# Patient Record
Sex: Female | Born: 1955 | Race: White | Hispanic: No | Marital: Single | Smoking: Never smoker
Health system: Southern US, Community
[De-identification: ages and names within clinical notes are randomized; demographics above are authoritative.]

## PROBLEM LIST (undated history)

## (undated) DIAGNOSIS — F32A Depression, unspecified: Secondary | ICD-10-CM

## (undated) DIAGNOSIS — E559 Vitamin D deficiency, unspecified: Secondary | ICD-10-CM

## (undated) DIAGNOSIS — F429 Obsessive-compulsive disorder, unspecified: Secondary | ICD-10-CM

## (undated) DIAGNOSIS — R7309 Other abnormal glucose: Secondary | ICD-10-CM

## (undated) DIAGNOSIS — J45909 Unspecified asthma, uncomplicated: Secondary | ICD-10-CM

## (undated) DIAGNOSIS — H33313 Horseshoe tear of retina without detachment, bilateral: Secondary | ICD-10-CM

## (undated) DIAGNOSIS — E079 Disorder of thyroid, unspecified: Secondary | ICD-10-CM

## (undated) DIAGNOSIS — S92909A Unspecified fracture of unspecified foot, initial encounter for closed fracture: Secondary | ICD-10-CM

## (undated) DIAGNOSIS — F419 Anxiety disorder, unspecified: Secondary | ICD-10-CM

## (undated) DIAGNOSIS — R112 Nausea with vomiting, unspecified: Secondary | ICD-10-CM

## (undated) DIAGNOSIS — J40 Bronchitis, not specified as acute or chronic: Secondary | ICD-10-CM

## (undated) DIAGNOSIS — Z8489 Family history of other specified conditions: Secondary | ICD-10-CM

## (undated) DIAGNOSIS — Z9889 Other specified postprocedural states: Secondary | ICD-10-CM

## (undated) DIAGNOSIS — E039 Hypothyroidism, unspecified: Secondary | ICD-10-CM

## (undated) DIAGNOSIS — F329 Major depressive disorder, single episode, unspecified: Secondary | ICD-10-CM

## (undated) HISTORY — DX: Disorder of thyroid, unspecified: E07.9

## (undated) HISTORY — PX: TONSILLECTOMY: SUR1361

## (undated) HISTORY — DX: Vitamin D deficiency, unspecified: E55.9

## (undated) HISTORY — DX: Major depressive disorder, single episode, unspecified: F32.9

## (undated) HISTORY — PX: RETINAL TEAR REPAIR CRYOTHERAPY: SHX5304

## (undated) HISTORY — DX: Anxiety disorder, unspecified: F41.9

## (undated) HISTORY — PX: EYE SURGERY: SHX253

## (undated) HISTORY — DX: Horseshoe tear of retina without detachment, bilateral: H33.313

## (undated) HISTORY — DX: Unspecified fracture of unspecified foot, initial encounter for closed fracture: S92.909A

## (undated) HISTORY — DX: Obsessive-compulsive disorder, unspecified: F42.9

## (undated) HISTORY — DX: Other abnormal glucose: R73.09

## (undated) HISTORY — DX: Depression, unspecified: F32.A

## (undated) HISTORY — PX: TONSILLECTOMY AND ADENOIDECTOMY: SHX28

## (undated) HISTORY — PX: DILATION AND CURETTAGE OF UTERUS: SHX78

---

## 1992-04-26 DIAGNOSIS — E079 Disorder of thyroid, unspecified: Secondary | ICD-10-CM

## 1992-04-26 HISTORY — DX: Disorder of thyroid, unspecified: E07.9

## 1998-01-29 ENCOUNTER — Other Ambulatory Visit: Admission: RE | Admit: 1998-01-29 | Discharge: 1998-01-29 | Payer: Self-pay | Admitting: Family Medicine

## 2000-12-02 ENCOUNTER — Encounter: Payer: Self-pay | Admitting: Family Medicine

## 2000-12-02 ENCOUNTER — Encounter: Admission: RE | Admit: 2000-12-02 | Discharge: 2000-12-02 | Payer: Self-pay | Admitting: Family Medicine

## 2002-10-19 ENCOUNTER — Encounter: Payer: Self-pay | Admitting: Family Medicine

## 2002-10-19 ENCOUNTER — Encounter: Admission: RE | Admit: 2002-10-19 | Discharge: 2002-10-19 | Payer: Self-pay | Admitting: Family Medicine

## 2002-10-26 ENCOUNTER — Encounter: Payer: Self-pay | Admitting: Family Medicine

## 2002-10-26 ENCOUNTER — Encounter: Admission: RE | Admit: 2002-10-26 | Discharge: 2002-10-26 | Payer: Self-pay | Admitting: Family Medicine

## 2004-02-12 ENCOUNTER — Encounter: Admission: RE | Admit: 2004-02-12 | Discharge: 2004-02-12 | Payer: Self-pay | Admitting: Family Medicine

## 2004-04-01 ENCOUNTER — Other Ambulatory Visit: Admission: RE | Admit: 2004-04-01 | Discharge: 2004-04-01 | Payer: Self-pay | Admitting: Family Medicine

## 2005-06-18 ENCOUNTER — Encounter: Admission: RE | Admit: 2005-06-18 | Discharge: 2005-06-18 | Payer: Self-pay | Admitting: Emergency Medicine

## 2006-07-21 ENCOUNTER — Other Ambulatory Visit: Admission: RE | Admit: 2006-07-21 | Discharge: 2006-07-21 | Payer: Self-pay | Admitting: Family Medicine

## 2006-11-01 ENCOUNTER — Encounter: Admission: RE | Admit: 2006-11-01 | Discharge: 2006-11-01 | Payer: Self-pay | Admitting: Family Medicine

## 2007-09-19 ENCOUNTER — Other Ambulatory Visit: Admission: RE | Admit: 2007-09-19 | Discharge: 2007-09-19 | Payer: Self-pay | Admitting: Family Medicine

## 2008-11-13 ENCOUNTER — Encounter: Admission: RE | Admit: 2008-11-13 | Discharge: 2008-11-13 | Payer: Self-pay | Admitting: Family Medicine

## 2009-01-09 ENCOUNTER — Encounter: Admission: RE | Admit: 2009-01-09 | Discharge: 2009-01-09 | Payer: Self-pay | Admitting: Family Medicine

## 2009-02-21 ENCOUNTER — Other Ambulatory Visit: Admission: RE | Admit: 2009-02-21 | Discharge: 2009-02-21 | Payer: Self-pay | Admitting: Family Medicine

## 2010-02-25 ENCOUNTER — Other Ambulatory Visit: Admission: RE | Admit: 2010-02-25 | Discharge: 2010-02-25 | Payer: Self-pay | Admitting: Family Medicine

## 2010-10-27 ENCOUNTER — Ambulatory Visit
Admission: RE | Admit: 2010-10-27 | Discharge: 2010-10-27 | Disposition: A | Discharge status: Home / Self Care | Payer: BC Managed Care – PPO | Source: Ambulatory Visit | Attending: Family Medicine | Admitting: Family Medicine

## 2010-10-27 ENCOUNTER — Other Ambulatory Visit: Payer: Self-pay | Admitting: Family Medicine

## 2010-10-27 DIAGNOSIS — W19XXXA Unspecified fall, initial encounter: Secondary | ICD-10-CM

## 2011-03-03 ENCOUNTER — Other Ambulatory Visit: Payer: Self-pay | Admitting: Family Medicine

## 2011-03-03 DIAGNOSIS — Z1231 Encounter for screening mammogram for malignant neoplasm of breast: Secondary | ICD-10-CM

## 2011-04-06 ENCOUNTER — Ambulatory Visit: Payer: BC Managed Care – PPO

## 2011-04-27 DIAGNOSIS — S92909A Unspecified fracture of unspecified foot, initial encounter for closed fracture: Secondary | ICD-10-CM

## 2011-04-27 HISTORY — DX: Unspecified fracture of unspecified foot, initial encounter for closed fracture: S92.909A

## 2011-05-28 ENCOUNTER — Ambulatory Visit: Payer: BC Managed Care – PPO

## 2011-07-05 ENCOUNTER — Ambulatory Visit: Payer: BC Managed Care – PPO

## 2011-08-30 ENCOUNTER — Ambulatory Visit: Payer: BC Managed Care – PPO

## 2011-09-02 ENCOUNTER — Ambulatory Visit
Admission: RE | Admit: 2011-09-02 | Discharge: 2011-09-02 | Disposition: A | Discharge status: Home / Self Care | Payer: BC Managed Care – PPO | Source: Ambulatory Visit | Attending: Family Medicine | Admitting: Family Medicine

## 2011-09-02 DIAGNOSIS — Z1231 Encounter for screening mammogram for malignant neoplasm of breast: Secondary | ICD-10-CM

## 2012-06-06 ENCOUNTER — Other Ambulatory Visit: Payer: Self-pay | Admitting: Family Medicine

## 2012-06-06 DIAGNOSIS — N950 Postmenopausal bleeding: Secondary | ICD-10-CM

## 2012-06-09 ENCOUNTER — Other Ambulatory Visit: Payer: BC Managed Care – PPO

## 2012-06-14 ENCOUNTER — Ambulatory Visit
Admission: RE | Admit: 2012-06-14 | Discharge: 2012-06-14 | Disposition: A | Discharge status: Home / Self Care | Payer: BC Managed Care – PPO | Source: Ambulatory Visit | Attending: Family Medicine | Admitting: Family Medicine

## 2012-06-14 DIAGNOSIS — N950 Postmenopausal bleeding: Secondary | ICD-10-CM

## 2012-07-13 ENCOUNTER — Encounter: Payer: Self-pay | Admitting: Obstetrics and Gynecology

## 2012-07-13 ENCOUNTER — Ambulatory Visit (INDEPENDENT_AMBULATORY_CARE_PROVIDER_SITE_OTHER): Payer: BC Managed Care – PPO | Admitting: Obstetrics and Gynecology

## 2012-07-13 ENCOUNTER — Other Ambulatory Visit: Payer: Self-pay | Admitting: Obstetrics and Gynecology

## 2012-07-13 VITALS — BP 120/74 | Ht 63.75 in | Wt 184.0 lb

## 2012-07-13 DIAGNOSIS — N950 Postmenopausal bleeding: Secondary | ICD-10-CM

## 2012-07-13 NOTE — Progress Notes (Signed)
Patient ID: Daisy [name], female   DOB: [birth year], 57 y.o.   MRN: [id number]  57 y.o.Single Caucasian never sexually active female complaining of recurrent history of vaginal bleeding. She describes it as spotting and sometimes like a period and  sporadically since starting starting HRT in 2005 .  She  is currently on PremPro HRT .  She has had previous episodes of menopausal bleeding, and her PCP has lowered the dosage of her PremPro  due to this.  She has been menopausal for  unknown years. Patient skipped menses in May 2005 for one month and then immediately began hormone replacement therapy.  She has not had prior [facility] testing.   Associated symptoms are anxiety (and hot flashes.)   Workup to date: pelvic ultrasound at [facility] 06/14/12.  Uterus 7.8 x 3.0 x 5.4 cm.  Endometrium 11 mm.  Right ovary 2.9 x 2.0 x 1.9 cm.  Left ovary 3.7 x 2.1 x 2.9 cm.   reports that she has never smoked. She does not have any smokeless tobacco history on file. She reports that she does not drink alcohol or use illicit drugs.  There is no problem list on file for this patient.   Past Medical History  Diagnosis Date  . Anxiety   . Depression   . OCD (obsessive compulsive disorder)   . Broken foot 2013    right  . Thyroid disease 1994    hypothyroid--Dr. Fonnie Birkenhead  . Vitamin D deficiency     Past Surgical History  Procedure Laterality Date  . Tonsillectomy and adenoidectomy      age 36    Current Outpatient Prescriptions  Medication Sig Dispense Refill  . Albuterol (VENTOLIN IN) Inhale 2 puffs into the lungs as needed.      . Cholecalciferol (VITAMIN D3) 2000 UNITS capsule Take 2,000 Units by mouth 2 (two) times daily.      . fexofenadine (ALLEGRA) 180 MG tablet Take 180 mg by mouth as needed.      . fluticasone (VERAMYST) 27.5 MCG/SPRAY nasal spray Place 2 sprays into the nose as needed for rhinitis.      . Fluticasone-Salmeterol (ADVAIR) 250-50 MCG/DOSE AEPB Inhale 1 puff into the  lungs as needed.      Marland Kitchen LORazepam (ATIVAN) 1 MG tablet Take 1 tablet by mouth 2 (two) times daily.      . Multiple Vitamins-Minerals (MULTIVITAMIN PO) Take 1 tablet by mouth daily.      Marland Kitchen PREMPRO 0.3-1.5 MG per tablet Take 1 tablet by mouth daily.      . sertraline (ZOLOFT) 100 MG tablet Take 1.5 tablets by mouth daily.      Marland Kitchen SYNTHROID 100 MCG tablet Take 1 tablet by mouth daily.      . vitamin C (ASCORBIC ACID) 500 MG tablet Take 500 mg by mouth daily.       No current facility-administered medications for this visit.    WUJ:WJXBJYNWG items are noted in HPI.   Exam:    BP 120/74  Ht 5' 3.75" (1.619 m)  Wt 184 lb (83.462 kg)  BMI 31.84 kg/m2  LMP 04/26/2010  General appearance: overweight Abdomen: soft, nontender, nondistended.  No hepatosplenomegaly or organomegaly.   no inguinal nodes palpated  Pelvic: External genitalia:  no lesions              Bartholins and Skenes: normal                 Vagina: normal appearing vagina  with normal color and discharge, no lesions              Cervix: normal appearance                      Bimanual Exam:  Uterus:  uterus is normal size, shape, consistency and nontender.  Uterus retroverted.                                      Adnexa:    not indicated and normal adnexa in size, nontender and no masses                                      Rectovaginal: Not performed.                                      Anus:  defer exam  A:  Post menopausal bleeding  P:  Endometrial biopsy completed           .Endometrial Biopsy Procedure Note  Procedure Details  The risks (including infection, bleeding, pain, and uterine perforation) and benefits of the procedure were explained to the patient and Written and verbal informed consent was obtained.   The patient was placed in the dorsal lithotomy position.  Bimanual exam showed the uterus to be in the retroflexed position.  A speculum inserted in the vagina, and the cervix prepped with povidone  iodine.   A sharp tenaculum wasapplied to the anterior lip of the cervix for stabilization.   A Pipelle endometrial aspirator was used to sample the endometrium.  Sample was sent for pathologic examination.   Condition: Stable  Complications: None  Post procedure, patient developed cramping and nausea.  BP 142/82.  Pulse 70.  Patient given Ibuprophen 800 mg po, Sprite po, and observed.  Felt better at time of discharge.  Plan:  The patient was advised to call for any fever or for prolonged or severe pain or bleeding. She was advised to use Tylenol or Ibuprophen as needed for mild to moderate pain. She was advised to avoid vaginal intercourse for 48 hours or until the bleeding has completely stopped.  AVS given to patient.

## 2012-07-13 NOTE — Patient Instructions (Signed)
Postmenopausal Bleeding Menopause is commonly referred to as the "change in life." It is a time when the fertile years, the time of ovulating and having menstrual periods, has come to an end. It is also determined by not having menstrual periods for 12 months.  Postmenopausal bleeding is any bleeding a woman has after she has entered into menopause. Any type of postmenopausal bleeding, even if it appears to be a typical menstrual period, is concerning. This should be evaluated by your caregiver.  CAUSES   Hormone therapy.  Cancer of the cervix or cancer of the lining of the uterus (endometrial cancer).  Thinning of the uterine lining (uterine atrophy).  Thyroid diseases.  Certain medicines.  Infection of the uterus or cervix.  Inflammation or irritation of the uterine lining (endometritis).  Estrogen-secreting tumors.  Growths (polyps) on the cervix, uterine lining, or uterus.  Uterine tumors (fibroids).  Being very overweight (obese). DIAGNOSIS  Your caregiver will take a medical history and ask questions. A physical exam will also be performed. Further tests may include:   A transvaginal ultrasound. An ultrasound wand or probe is inserted into your vagina to view the pelvic organs.  A biopsy of the lining of the uterus (endometrium). A sample of the endometrium is removed and examined.  A hysteroscopy. Your caregiver may use an instrument with a light and a camera attached to it (hysteroscope). The hysteroscope is used to look inside the uterus for problems.  A dilation and curettage (D&C). Tissue is removed from the uterine lining to be examined for problems. TREATMENT  Treatment depends on the cause of the bleeding. Some treatments include:   Surgery.  Medicines.  Hormones.  A hysteroscopy or D&C to remove polyps or fibroids.  Changing or stopping a current medicine you are taking. Talk to your caregiver about your specific treatment. HOME CARE INSTRUCTIONS    Maintain a healthy weight.  Keep regular pelvic exams and Pap tests. SEEK MEDICAL CARE IF:   You have bleeding, even if it is light in comparison to your previous periods.  Your bleeding lasts more than 1 week.  You have abdominal pain.  You develop bleeding with sexual intercourse. SEEK IMMEDIATE MEDICAL CARE IF:   You have a fever, chills, headache, dizziness, muscle aches, and bleeding.  You have severe pain with bleeding.  You are passing blood clots.  You have bleeding and need more than 1 pad an hour.  You feel faint. MAKE SURE YOU:  Understand these instructions.  Will watch your condition.  Will get help right away if you are not doing well or get worse. Document Released: 07/21/2005 Document Revised: 07/05/2011 Document Reviewed: 12/17/2010 ExitCare Patient Information 2013 [employer]. Endometrial Biopsy This is a test in which a tissue sample (a biopsy) is taken from inside the uterus (womb). It is then looked at by a specialist under a microscope to see if the tissue is normal or abnormal. The endometrium is the lining of the uterus. This test helps determine where you are in your menstrual cycle and how hormone levels are affecting the lining of the uterus. Another use for this test is to diagnose endometrial cancer, tuberculosis, polyps, or inflammatory conditions and to evaluate uterine bleeding. PREPARATION FOR TEST No preparation or fasting is necessary. NORMAL FINDINGS No pathologic conditions. Presence of "secretory-type" endometrium 3 to 5 days before to normal menstruation. Ranges for normal findings may vary among different laboratories and hospitals. You should always check with your doctor after having lab work   or other tests done to discuss the meaning of your test results and whether your values are considered within normal limits. MEANING OF TEST  Your caregiver will go over the test results with you and discuss the importance and meaning of  your results, as well as treatment options and the need for additional tests if necessary. OBTAINING THE TEST RESULTS It is your responsibility to obtain your test results. Ask the lab or department performing the test when and how you will get your results. Document Released: 08/13/2004 Document Revised: 07/05/2011 Document Reviewed: 03/22/2008 ExitCare Patient Information 2013 [employer].  

## 2012-07-17 ENCOUNTER — Telehealth: Payer: Self-pay | Admitting: Obstetrics and Gynecology

## 2012-07-17 NOTE — Telephone Encounter (Signed)
PT CALLING WITH QUESTIONS REGARDING WHAT SHE CAN AND CAN'T DO AFTER THE BIOSPY.

## 2012-07-18 NOTE — Telephone Encounter (Signed)
Please inform patient she can resume all normal activities at this point.  Results of the biopsy are pending at this time.

## 2012-07-18 NOTE — Telephone Encounter (Signed)
Spoke with pt who forgot to ask Dr Edward Jolly questions on what was OK to do after her uterine bx. Pt states she is doing fine, a little sore, with some spotting. Pt wondering how long to wait to go to yoga, take walks, take a bath. Please advise. AA

## 2012-07-19 NOTE — Telephone Encounter (Signed)
Reached pt's VM confirming name. Relayed msg from [facility] that she may resume all normal activities as she feels able. Results of bx are pending. Pt to call back with any questions. aa

## 2012-07-24 ENCOUNTER — Telehealth: Payer: Self-pay | Admitting: Obstetrics and Gynecology

## 2012-07-24 NOTE — Telephone Encounter (Signed)
Pt calling to get her results from her last visit

## 2012-07-25 NOTE — Telephone Encounter (Signed)
Patient is requesting results from endometrial biopsy done on 07/13/2012 by Dr. Edward Jolly. Explained will look for results and let Dr,. Edward Jolly review and notify her of results. To keep appt. With Dr Edward Jolly on Friday. Notified [name] in lab for results and is to call path lab.  Daisy [name]

## 2012-07-25 NOTE — Telephone Encounter (Signed)
Left message for patient to call our office.  sue

## 2012-07-26 NOTE — Telephone Encounter (Signed)
Patient notified of Biopsy Benign and to keep appt. With Dr. Edward Jolly. States has appt. On Friday with Dr. Edward Jolly. sue

## 2012-07-26 NOTE — Telephone Encounter (Signed)
Left message on patient call # to return call for results. Daisy [name]

## 2012-07-26 NOTE — Telephone Encounter (Signed)
Biopsy benign.  Please keep appointment to discuss.

## 2012-07-28 ENCOUNTER — Encounter: Payer: Self-pay | Admitting: Obstetrics and Gynecology

## 2012-07-28 ENCOUNTER — Inpatient Hospital Stay
Admission: RE | Admit: 2012-07-28 | Discharge: 2012-07-28 | Disposition: A | Discharge status: Home / Self Care | Payer: Self-pay | Source: Ambulatory Visit | Attending: Obstetrics and Gynecology | Admitting: Obstetrics and Gynecology

## 2012-07-28 ENCOUNTER — Ambulatory Visit (INDEPENDENT_AMBULATORY_CARE_PROVIDER_SITE_OTHER): Payer: BC Managed Care – PPO | Admitting: Obstetrics and Gynecology

## 2012-07-28 ENCOUNTER — Ambulatory Visit: Payer: BC Managed Care – PPO | Admitting: Obstetrics and Gynecology

## 2012-07-28 VITALS — BP 110/68 | Resp 12 | Wt 184.0 lb

## 2012-07-28 DIAGNOSIS — N950 Postmenopausal bleeding: Secondary | ICD-10-CM

## 2012-07-28 MED ORDER — MISOPROSTOL 200 MCG PO TABS: 200.0000 ug | ORAL_TABLET | Freq: Once | ORAL | Status: DC

## 2012-07-28 NOTE — Progress Notes (Signed)
Patient ID: Daisy [name], female   DOB: 1955/10/06, 57 y.o.   MRN: [id number]  Subjective  Patient is here for follow up of endometrial biopsy.  Prior ultrasound has showed 11 mm thickness.  Last episode of bleeding was 2nd week in February.  Ran out of HRT in January and was off for several days.  Had bleeding and cramping.  Refilled HRT.  Then had another bleeding episode 3 - 4 weeks later in 2nd week in February.  Lowered dose of PremPro and is doing well with good control of hot flashes.    Objective  Pathology report 07/13/12 - Endometrial Biopsy  Benign weakly proliferative endometrium.  No atypia, hyperplasia, or malignancy.  Assessment  Postmenopausal bleeding on HRT. Benign endometrial biopsy. Thickened endometrium  Plan  Stop HRT. Check FSH and estradiol after off for 2 weeks. Return for sonohysterogram to rule out polyp. Will pretreat with cytotec and plan for paracervical biopsy. Patiet will take 600 - 800 mg ibuprophen prior to her visit.

## 2012-08-03 ENCOUNTER — Telehealth: Payer: Self-pay | Admitting: Obstetrics and Gynecology

## 2012-08-03 NOTE — Telephone Encounter (Signed)
Pt "has been thinking."  Pt went off of hormones and is asking to come in for bloodwork.  Pt is on day 6 of being off of hormones and has only had 1 hot flash and very little bleeding.  Pt says "honestly, the thought of doing that procedure (in reference to PUS/SHG), I don't know about doing that now."  Pt says Dr. Edward Jolly mentioned that if the lining had gone down she'd be less concerned.  Pt has gone off of oral meds and is now asking for "a patch or a cream or something."  She wants to know if Dr. Edward Jolly could just do a regular ultrasound and see how lining is now as opposed to PUS/SHG.  If lining is not thinner, pt "would rather have a D&C."  Pt says that on Monday (4/14) she will have been off of her hormones for 10 days and would like to have labwork at that time.  Pt is keeping PUS/SHG appt for 4/23 but prefers PUS only, please advise.  Please schedule labwork.

## 2012-08-03 NOTE — Telephone Encounter (Signed)
Spoke to patient regarding her concerns for [facility].  She had vagal response to endo bx and is concerned that she wont be able to tolerate the [facility]. She feels very strongly that she would like labwork first and then regular ultrasound to recheck lining and see if [facility] can be avoided.  Spoke with patient for several minutes regarding vagal response, out pt D&C requires approx 4 hrs at hosp with associated costs. No right or wrong answer except what patient and dr Edward Jolly comfortable with. Patient really would like to avoid SHGM if at all possible, will plan for labs on Mon 08-07-12.  Should we cancel SHGM or leave on schedule in case patient decides to have it day she is here (after talkingwith MD) ?  Cytotec? Lab orders?

## 2012-08-05 ENCOUNTER — Other Ambulatory Visit: Payer: Self-pay | Admitting: Obstetrics and Gynecology

## 2012-08-05 DIAGNOSIS — N951 Menopausal and female climacteric states: Secondary | ICD-10-CM

## 2012-08-05 NOTE — Telephone Encounter (Signed)
Please contact patient with the following proposed plan:  - Will plan to measure FSH and estradiol.  Order will be placed in EPIC. - Proceed with pelvic ultrasound in office.  Patient has decreased dosage of HRT prior to stopping.  Will measure endometrial thickness at ultrasound. - If lining looks thickened or irregular, will proceed with sonohysterogram at that time.  In preparation for this, please have the patient take the Cytotec the night prior.  This has already been ordered.  She should eat before her appointment and take 600 - 800 mg ibuprofen prior to her visit.  She should have someone with her to drive her home if necessary.  - If lining is still thickened and patient is not amenable to sonohysterogram, will proceed with hospital hysteroscopy/dilation and curettage on another day.  Thank you.

## 2012-08-07 ENCOUNTER — Other Ambulatory Visit (INDEPENDENT_AMBULATORY_CARE_PROVIDER_SITE_OTHER): Payer: BC Managed Care – PPO

## 2012-08-07 DIAGNOSIS — N951 Menopausal and female climacteric states: Secondary | ICD-10-CM

## 2012-08-07 DIAGNOSIS — Z0189 Encounter for other specified special examinations: Secondary | ICD-10-CM

## 2012-08-07 NOTE — Telephone Encounter (Signed)
Patient calls and reports that hot flashes have increased significantly over weekend.  Having them every hour and reports that they are a 4 on a scale of 1-10.  Has decided she will have SHGM next week in office.  Has Rx for Ativan and will take one that morning.  Advised needs to sign consent while here today.  Patient asking when she can restart hormones?  If labs today, can she restart tonight?

## 2012-08-07 NOTE — Telephone Encounter (Signed)
OK to restart hormones.    Results are pending at this time.

## 2012-08-07 NOTE — Telephone Encounter (Signed)
Patient notified may restart hormones, "hooray" per patient.

## 2012-08-07 NOTE — Telephone Encounter (Signed)
LMTCB, patient has lab appt this afternoon but LM for her to call me or see me at lab appt to discuss Dr Rica Records instruction.

## 2012-08-08 ENCOUNTER — Inpatient Hospital Stay
Admission: RE | Admit: 2012-08-08 | Discharge: 2012-08-08 | Disposition: A | Discharge status: Home / Self Care | Payer: Self-pay | Source: Ambulatory Visit | Attending: Obstetrics and Gynecology | Admitting: Obstetrics and Gynecology

## 2012-08-08 ENCOUNTER — Other Ambulatory Visit: Payer: Self-pay | Admitting: Obstetrics and Gynecology

## 2012-08-08 DIAGNOSIS — N950 Postmenopausal bleeding: Secondary | ICD-10-CM

## 2012-08-08 DIAGNOSIS — R9389 Abnormal findings on diagnostic imaging of other specified body structures: Secondary | ICD-10-CM

## 2012-08-08 LAB — FOLLICLE STIMULATING HORMONE: FSH: 46.3 m[IU]/mL

## 2012-08-08 LAB — ESTRADIOL: Estradiol: <11.8 pg/mL

## 2012-08-15 ENCOUNTER — Other Ambulatory Visit: Payer: Self-pay | Admitting: *Deleted

## 2012-08-15 ENCOUNTER — Telehealth: Payer: Self-pay | Admitting: Obstetrics and Gynecology

## 2012-08-15 DIAGNOSIS — N950 Postmenopausal bleeding: Secondary | ICD-10-CM

## 2012-08-15 NOTE — Telephone Encounter (Signed)
Appt rescheduled to 09-06-12 per patient request. Patient agreeable.  She has restarted her hormones and is asking about changing to "external" hormones.  Advised dr Edward Jolly may not want to make any hormonal changes until PUS/ENDo BX complete. Patient states she will just 'keep doing what she is doing".

## 2012-08-15 NOTE — Telephone Encounter (Signed)
Pt would like to reschedule to 09/06/12 or any wed. In june

## 2012-08-15 NOTE — Telephone Encounter (Signed)
Patient will need to complete visit for sonohysterogram before making any changes in her HRT regimen.  Thank you,  Conley Simmonds, M.D.

## 2012-08-16 ENCOUNTER — Other Ambulatory Visit: Payer: BC Managed Care – PPO

## 2012-08-16 ENCOUNTER — Other Ambulatory Visit: Payer: Self-pay | Admitting: Obstetrics and Gynecology

## 2012-08-21 ENCOUNTER — Telehealth: Payer: Self-pay | Admitting: Obstetrics and Gynecology

## 2012-08-21 DIAGNOSIS — R9389 Abnormal findings on diagnostic imaging of other specified body structures: Secondary | ICD-10-CM

## 2012-08-21 NOTE — Telephone Encounter (Signed)
Pt had her blood drawn a couple weeks ago and had stopped her hormones. She started the hormones back up but still feels somewhat bad. She would like to know how long it will take her body to readjust to the hormones. It has been 2 weeks since she started taking it again. She is having 3-4 hot flashes a day. When she has a big hot flash she gets a headache, sweats, and drained. She said that she feels drained due to work as well but the hot flashes are making it worse. Pt was on Doxepin for many years. Switched to Zoloft last year. Zoloft doesn't help as much with the hot flashes. In February the patient decreased from .4 to .3 dosage due to her cycle. She is not sure if the .3 is enough. Pt was not having hot flashes when on the .4 dosage. Pt also takes Ativan to help her sleep.

## 2012-09-04 NOTE — Telephone Encounter (Signed)
Patient has concerns and questions about her upcoming [place] appointment.

## 2012-09-04 NOTE — Telephone Encounter (Signed)
Patient had questions about [facility] and who did the actual procedure.  Discussed procedure briefly.  These questions came up when patient called in to reschedule her ultrasound appointment to 10-11-12.  She states she is having some thyroid issues and saw endocrinologist today.  She states she must get those issues resolved "before she can dance with [place]".

## 2012-09-06 ENCOUNTER — Other Ambulatory Visit: Payer: Self-pay

## 2012-09-06 ENCOUNTER — Other Ambulatory Visit: Payer: Self-pay | Admitting: Obstetrics and Gynecology

## 2012-09-06 NOTE — Telephone Encounter (Signed)
Call to patient to advise Dr Edward Jolly will reschedule appointment as PUS just to recheck endometrial lining.  Want to facilitate procedure and recommend she have this done sooner if she is feeling able.  Advised that this is an abnormal finding that needs follow up to rule out any abnormal cause.  Patient states that "biopsy was negative" so I explained that although it was negative, we need to recheck the thickness of endometrium to determine if this is increasing and that although negative biopsy, the thickened endometrium could lead to abnormal cells and follow up is recommended that she not continue to delay.  Patient has multiple commitments but agrees to "change things around' and appointment moved up to 09-11-12.  New PUS order entered.

## 2012-09-06 NOTE — Telephone Encounter (Signed)
Kennon Rounds,  At this point, it may be helpful just to do a routine pelvic ultrasound in the office without the sonohysterogram.  The last ultrasound was done in February showing the endometrial thickness.  I believe that patient is very anxious about this upcoming sonohysterogram due to the canceled and rescheduled appointments.  [employer]

## 2012-09-11 ENCOUNTER — Encounter: Payer: Self-pay | Admitting: Obstetrics and Gynecology

## 2012-09-11 ENCOUNTER — Ambulatory Visit (INDEPENDENT_AMBULATORY_CARE_PROVIDER_SITE_OTHER): Payer: BC Managed Care – PPO | Admitting: Obstetrics and Gynecology

## 2012-09-11 ENCOUNTER — Ambulatory Visit (INDEPENDENT_AMBULATORY_CARE_PROVIDER_SITE_OTHER): Payer: BC Managed Care – PPO

## 2012-09-11 ENCOUNTER — Other Ambulatory Visit: Payer: Self-pay | Admitting: Obstetrics and Gynecology

## 2012-09-11 VITALS — BP 120/76

## 2012-09-11 DIAGNOSIS — N83339 Acquired atrophy of ovary and fallopian tube, unspecified side: Secondary | ICD-10-CM

## 2012-09-11 DIAGNOSIS — R9389 Abnormal findings on diagnostic imaging of other specified body structures: Secondary | ICD-10-CM

## 2012-09-11 DIAGNOSIS — N83202 Unspecified ovarian cyst, left side: Secondary | ICD-10-CM

## 2012-09-11 DIAGNOSIS — N83209 Unspecified ovarian cyst, unspecified side: Secondary | ICD-10-CM

## 2012-09-11 DIAGNOSIS — N839 Noninflammatory disorder of ovary, fallopian tube and broad ligament, unspecified: Secondary | ICD-10-CM

## 2012-09-11 DIAGNOSIS — D4959 Neoplasm of unspecified behavior of other genitourinary organ: Secondary | ICD-10-CM

## 2012-09-11 DIAGNOSIS — N854 Malposition of uterus: Secondary | ICD-10-CM

## 2012-09-11 DIAGNOSIS — N950 Postmenopausal bleeding: Secondary | ICD-10-CM

## 2012-09-11 NOTE — Patient Instructions (Signed)
Please read your pamphlets about hysteroscopy, laparoscopy, and ovarian cysts.

## 2012-09-11 NOTE — Progress Notes (Signed)
Subjective  Here for pelvic ultrasound follow up.  Previous outside transabdominal scan showed thickened endometrium measuring 11 mm and normal ovaries.  Patient had office endometrial biopsy which was negative for a polyp, hyperplasia, and malignancy.  Patient was scheduled for a sonohysterogram but had trepidation due to a vagal response following the endometrial biopsy.  Since the patient's last visit, she saw Dr. Sharl Ma for thyroid recheck.  Synthroid increased to 125 mcg from 100 mcg.    Interested in the future to take transdermal hormone therapy.  Currently she is on po HRT.  Objective  Transvaginal ultrasound below:  Thickened endometrium suspicious for a polyp measuring 29 x 12 mm.  Left ovary with solid calcified area measuring  11 x 9 mm.  Minimal fluid seen in the endometrial cavity.  Assessment  Postmenopausal bleeding on hormone therapy. Thickened endometrium, suspicious for a polyp.  Has had negative endometrial biopsy, which I believe is a false negative. Small calcified mass  of the left ovary.  No abnormal doppoer flow to the ovary.  Plan  Do OVA1 today. Plan to proceed with a minimally invasive approach including a hysteroscopic polypectomy with dilation and curettage and laparoscopic left salpingo-oophorectomy with pelvic washings, the final surgical plan which will depend on the OVA1 results.  Risks, benefits, and alternatives have been discussed with the patient who wishes to proceed on October 10, 2012 if possible.

## 2012-09-13 LAB — OVA 1: OVA 1: 4.9

## 2012-09-15 ENCOUNTER — Telehealth: Payer: Self-pay | Admitting: Obstetrics and Gynecology

## 2012-09-15 DIAGNOSIS — R9389 Abnormal findings on diagnostic imaging of other specified body structures: Secondary | ICD-10-CM

## 2012-09-15 DIAGNOSIS — N83202 Unspecified ovarian cyst, left side: Secondary | ICD-10-CM

## 2012-09-15 NOTE — Telephone Encounter (Signed)
Phone call returned to patient.  Patient inquiring why she has an appointment at the [facility].  Patient was notified by My Chart.  OVA 1 test result returned at 4.9, which is elevated for a menopausal patient, but is the upper limit of normal for a premenopausal patient.  GYN Oncology was contacted yesterday to make an appointment for the patient prior to informing patient of the abnormal test result, so that the consultation would already be scheduled when the patient received her abnormal test result.  GYN Oncology at the [facility] in [place] scheduled the appointment this morning at 8:45 am and the patient apparently received an automated message about the appointment without my knowledge or the knowledge of the GYN Oncology team.  I apologized to the patient regarding the sequence of events and told her that I have no information to date that she has a definitive cancer.  I explained the importance of having an oncology consultation to do good surgical planning and care in the event that the patient were to have a cancer discovered at the time of frozen section so that she would have a proper staging procedure. 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   She will check in at the [facility] on September 26, 2012 at 11:30 for a 12:00 appointment time.    The GYN ONC team will be notified regarding the release of appointments through My Chart.

## 2012-09-19 NOTE — Progress Notes (Signed)
Dr Edward Jolly spoke to patient personally on 09-15-12 and notified of results and appointment.

## 2012-09-19 NOTE — Progress Notes (Signed)
Patient notified by dr Edward Jolly on 09-15-12 and appointment info given.

## 2012-09-20 ENCOUNTER — Encounter: Payer: Self-pay | Admitting: Obstetrics and Gynecology

## 2012-09-25 ENCOUNTER — Telehealth: Payer: Self-pay | Admitting: Gynecologic Oncology

## 2012-09-25 NOTE — Telephone Encounter (Signed)
Consult Note: Gyn-Onc  Consult was requested by Dr. Edward Jolly  for the evaluation of Aviva Kluver 57 y.o. female  CC:    Assessment/Plan:  Ms. NEITA LANDRIGAN  is a 57 y.o.  year old **    HPI: **Endometrial biopsy 06/2012  - BENIGN WEAKLY PROLIFERATIVE ENDOMETRIUM, NO ATYPIA, HYPERPLASIA OR MALIGNANCY  OVA 1 09/11/2012 4.9  Reference range (Postmenopausal: abnormal >4.4)   Current Meds:  Outpatient Encounter Prescriptions as of 09/25/2012  Medication Sig Dispense Refill  . Albuterol (VENTOLIN IN) Inhale 2 puffs into the lungs as needed.      . Cholecalciferol (VITAMIN D3) 2000 UNITS capsule Take 2,000 Units by mouth 2 (two) times daily.      . fexofenadine (ALLEGRA) 180 MG tablet Take 180 mg by mouth as needed.      . fluticasone (VERAMYST) 27.5 MCG/SPRAY nasal spray Place 2 sprays into the nose as needed for rhinitis.      . Fluticasone-Salmeterol (ADVAIR) 250-50 MCG/DOSE AEPB Inhale 1 puff into the lungs as needed.      Marland Kitchen levothyroxine (SYNTHROID, LEVOTHROID) 125 MCG tablet Take 125 mcg by mouth daily before breakfast.      . LORazepam (ATIVAN) 1 MG tablet Take 1 tablet by mouth 2 (two) times daily.      . misoprostol (CYTOTEC) 200 MCG tablet Take 1 tablet (200 mcg total) by mouth once.  1 tablet  0  . Multiple Vitamins-Minerals (MULTIVITAMIN PO) Take 1 tablet by mouth daily.      Marland Kitchen PREMPRO 0.3-1.5 MG per tablet Take 1 tablet by mouth daily.      . sertraline (ZOLOFT) 100 MG tablet Take 1.5 tablets by mouth daily.      Marland Kitchen SYNTHROID 100 MCG tablet Take 1 tablet by mouth daily.      . vitamin C (ASCORBIC ACID) 500 MG tablet Take 500 mg by mouth daily.       No facility-administered encounter medications on file as of 09/25/2012.    Allergy:  Allergies  Allergen Reactions  . Sudafed (Pseudoephedrine Hcl) Other (See Comments)  . Thiram (Tetramethylthiuram Disulfide)   . Thimerosal Rash    Social Hx:   History   Social History  . Marital Status: Single    Spouse Name: N/A   Number of Children: N/A  . Years of Education: N/A   Occupational History  . Not on file.   Social History Main Topics  . Smoking status: Never Smoker   . Smokeless tobacco: Not on file  . Alcohol Use: No  . Drug Use: No  . Sexually Active: No     Comment: pt. is a virgin   Other Topics Concern  . Not on file   Social History Narrative  . No narrative on file    Past Surgical Hx:  Past Surgical History  Procedure Laterality Date  . Tonsillectomy and adenoidectomy      age 7    Past Medical Hx:  Past Medical History  Diagnosis Date  . Anxiety   . Depression   . OCD (obsessive compulsive disorder)   . Broken foot 2013    right  . Thyroid disease 1994    hypothyroid--Dr. Fonnie Birkenhead  . Vitamin D deficiency     Past Gynecological History:  ** Patient's last menstrual period was 04/26/2010.  Family Hx:  Family History  Problem Relation Age of Onset  . Osteoarthritis Mother   . Thyroid disease Mother   . Lung cancer Father  mets to liver/dec'd age 68  . Hypertension Father   . Thyroid disease Maternal Grandmother     Review of Systems:  Constitutional  Feels well,  ** Skin/Breast  No rash, sores, jaundice, itching, dryness Cardiovascular  No chest pain, shortness of breath, or edema  Pulmonary  No cough or wheeze.  Gastro Intestinal  No nausea, vomitting, or diarrhoea. No bright red blood per rectum, no abdominal pain, change in bowel movement, or constipation.  Genito Urinary  No frequency, urgency, dysuria, ** Musculo Skeletal  No myalgia, arthralgia, joint swelling or pain  Neurologic  No weakness, numbness, change in gait,  Psychology  No depression, anxiety, insomnia.   Vitals:  @V @  Physical Exam: WD in NAD Neck  Supple NROM, without any enlargements.  Lymph Node Survey No cervical supraclavicular or inguinal adenopathy Cardiovascular  Pulse normal rate, regularity and rhythm. S1 and S2 normal.  Lungs  Clear to  auscultation bilateraly, without wheezes/crackles/rhonchi. Good air movement.  Skin  No rash/lesions/breakdown  Psychiatry  Alert and oriented to person, place, and time  Abdomen  Normoactive bowel sounds, abdomen soft, non-tender and obese. Surgical  sites intact without evidence of hernia.  Back No CVA tenderness Genito Urinary  Vulva/vagina: Normal external female genitalia.  No lesions. No discharge or bleeding.  Bladder/urethra:  No lesions or masses  Vagina: **  Cervix: Normal appearing, no lesions.  Uterus: Small, mobile, no parametrial involvement or nodularity.  Adnexa: No palpable masses. Rectal  Good tone, no masses no cul de sac nodularity.  Extremities  No bilateral cyanosis, clubbing or edema.   Laurette Schimke, MD, PhD 09/25/2012, 9:43 PM

## 2012-09-26 ENCOUNTER — Encounter: Payer: Self-pay | Admitting: Gynecologic Oncology

## 2012-09-26 ENCOUNTER — Ambulatory Visit: Payer: BC Managed Care – PPO | Attending: Gynecologic Oncology | Admitting: Gynecologic Oncology

## 2012-09-26 ENCOUNTER — Encounter: Payer: Self-pay | Admitting: Obstetrics and Gynecology

## 2012-09-26 VITALS — BP 126/80 | HR 64 | Temp 97.9°F | Resp 16 | Ht 63.94 in | Wt 183.6 lb

## 2012-09-26 DIAGNOSIS — E039 Hypothyroidism, unspecified: Secondary | ICD-10-CM | POA: Insufficient documentation

## 2012-09-26 DIAGNOSIS — N83339 Acquired atrophy of ovary and fallopian tube, unspecified side: Secondary | ICD-10-CM | POA: Insufficient documentation

## 2012-09-26 DIAGNOSIS — N838 Other noninflammatory disorders of ovary, fallopian tube and broad ligament: Secondary | ICD-10-CM | POA: Insufficient documentation

## 2012-09-26 DIAGNOSIS — N950 Postmenopausal bleeding: Secondary | ICD-10-CM

## 2012-09-26 DIAGNOSIS — N859 Noninflammatory disorder of uterus, unspecified: Secondary | ICD-10-CM | POA: Insufficient documentation

## 2012-09-26 DIAGNOSIS — Z79899 Other long term (current) drug therapy: Secondary | ICD-10-CM | POA: Insufficient documentation

## 2012-09-26 NOTE — Patient Instructions (Addendum)
Follow up with Dr Edward Jolly as scheduled.  Thank you very much Ms. Aviva Kluver for allowing me to provide care for you today.  I appreciate your confidence in choosing our Gynecologic Oncology team.  If you have any questions about your visit today please call our office and we will get back to you as soon as possible.  Maryclare Labrador. Odean Mcelwain MD., PhD Gynecologic Oncology

## 2012-09-26 NOTE — Progress Notes (Signed)
Consult Note: Gyn-Onc  Consult was requested by Dr. Edward [name]  for the evaluation of Daisy [name] 58 y.o. female  CC:    Assessment/Plan:  [name]  is a 57 y.o.  year old with a likely endometrial polyp and an incidental finding of calcification in the left adnexa and a minimally elevated ova 1.  Recommendation was that she proceed as recommended by Dr. Edward [name]. That is hysteroscopy with removal of the polyp and laparoscopic left salpingo-oophorectomy.    In the event that malignancy is confirmed on final pathology the patient is aware that a second minimally invasive procedure would be attempted. This however is not viewed as a likely outcome   HPI: Daisy [name]  Is a 57 y.o. nulliparous female who has been on Prempro since age 65. Over the last 10 years she's had approximately 4 episodes of vaginal bleeding/spotting. In February of 2014 she experienced light menses. An ultrasound was obtained and was notable for a thickened endometrium.  Pelvic UTZ 06/14/2012 Uterus: Normal. 7.8 x 3.0 x 5.4 cm. Endometrium: Endometrium is 11 mm in thickness. Right ovary: Normal. 2.9 x 2.0 x 1.9 cm. Left ovary: Normal. 3.7 x 2.1 x 2.9 cm.  She was then referred to Dr. Edward [name] and an endometrial biopsy 06/2012  - BENIGN WEAKLY PROLIFERATIVE ENDOMETRIUM, NO ATYPIA, HYPERPLASIA OR MALIGNANCY.  A repeat ultrasound confirmed the presence of a persistent thickened endometrial stripe consistent with the presence of a polyp. At that time ( 5/19 2014) findings were notable notable for an atrophic left ovary with calcification.  OVA 1 09/11/2012 4.9  Reference range (Postmenopausal: abnormal >4.4).  [name] denies abdominal bloating early satiety weight loss changes in bowel or rectal habits.  Current Meds:  Outpatient Encounter Prescriptions as of 09/26/2012  Medication Sig Dispense Refill  . Albuterol (VENTOLIN IN) Inhale 2 puffs into the lungs as needed.      . Cholecalciferol (VITAMIN D3) 2000 UNITS  capsule Take 2,000 Units by mouth 2 (two) times daily.      . fexofenadine (ALLEGRA) 180 MG tablet Take 180 mg by mouth as needed.      . fluticasone (VERAMYST) 27.5 MCG/SPRAY nasal spray Place 2 sprays into the nose as needed for rhinitis.      . Fluticasone-Salmeterol (ADVAIR) 250-50 MCG/DOSE AEPB Inhale 1 puff into the lungs as needed.      Marland Kitchen levothyroxine (SYNTHROID, LEVOTHROID) 125 MCG tablet Take 125 mcg by mouth daily before breakfast.      . LORazepam (ATIVAN) 1 MG tablet Take 1 tablet by mouth 2 (two) times daily.      . misoprostol (CYTOTEC) 200 MCG tablet Take 1 tablet (200 mcg total) by mouth once.  1 tablet  0  . Multiple Vitamins-Minerals (MULTIVITAMIN PO) Take 1 tablet by mouth daily.      Marland Kitchen PREMPRO 0.3-1.5 MG per tablet Take 1 tablet by mouth daily.      . sertraline (ZOLOFT) 100 MG tablet Take 1.5 tablets by mouth daily.      Marland Kitchen SYNTHROID 100 MCG tablet Take 1 tablet by mouth daily.      . vitamin C (ASCORBIC ACID) 500 MG tablet Take 500 mg by mouth daily.       No facility-administered encounter medications on file as of 09/26/2012.    Allergy:  Allergies  Allergen Reactions  . Sudafed (Pseudoephedrine Hcl) Other (See Comments)  . Thiram (Tetramethylthiuram Disulfide)   . Thimerosal Rash    Social Hx:  History   Social History  . Marital Status: Single    Spouse Name: N/A    Number of Children: N/A  . Years of Education: N/A   Occupational History  . Not on file.   Social History Main Topics  . Smoking status: Never Smoker   . Smokeless tobacco: Not on file  . Alcohol Use: No  . Drug Use: No  . Sexually Active: No     Comment: pt. is a virgin   Other Topics Concern  . Not on file   Social History Narrative  . No narrative on file    Past Surgical Hx:  Past Surgical History  Procedure Laterality Date  . Tonsillectomy and adenoidectomy      age 84    Past Medical Hx:  Past Medical History  Diagnosis Date  . Anxiety   . Depression   . OCD  (obsessive compulsive disorder)   . Broken foot 2013    right  . Thyroid disease 1994    hypothyroid--Dr. Fonnie [name]  . Vitamin D deficiency     Past Gynecological History: Gravida 0 regular menses until age 48 at which time she was placed on Prempro. Over the last 10 years there've been 4 episodes of vaginal bleeding  Family Hx:  Family History  Problem Relation Age of Onset  . Osteoarthritis Mother   . Thyroid disease Mother   . Lung cancer Father     mets to liver/dec'd age 843  . Hypertension Father   . Thyroid disease Maternal Grandmother     Review of Systems:  Constitutional  Feels well, no recent weight Skin/Breast  No rash, sores, jaundice, itching, dryness Cardiovascular  No chest pain, shortness of breath, or edema  Pulmonary  No cough or wheeze.  Gastro Intestinal  No nausea, vomitting, or diarrhoea. No bright red blood per rectum, no abdominal pain, change in bowel movement, or constipation. No bloating early satiety or anorexia Genito Urinary  No frequency, urgency, dysuria, occasional vaginal spotting no vaginal discharge  Musculo Skeletal  No myalgia, arthralgia, joint swelling or pain  Neurologic  No weakness, numbness, change in gait,  Psychology  No depression, anxiety, insomnia.   Vitals: BP 126/80  Pulse 64  Temp(Src) 97.9 F (36.6 C) (Oral)  Resp 16  Ht 5' 3.94" (1.624 m)  Wt 183 lb 9.6 oz (83.28 kg)  BMI 31.58 kg/m2  LMP 04/26/2010  Physical Exam: WD in NAD Neck  Supple NROM, without any enlargements.  Lymph Node Survey No cervical supraclavicular or inguinal adenopathy Cardiovascular  Pulse normal rate, regularity and rhythm. S1 and S2 normal.  Lungs  Clear to auscultation bilateraly,  Skin  No rash/lesions/breakdown  Psychiatry  Alert and oriented to person, place, and time  Abdomen  Normoactive bowel sounds, abdomen soft, non-tender and obese. No evidence of ascites no palpable omental cake Back No CVA  tenderness Genito Urinary  Vulva/vagina: Normal external female genitalia.  No lesions. No discharge or bleeding.  Bladder/urethra:  No lesions or masses  Vagina: Atrophic no bleeding  ` Cervix: Normal appearing, no lesions.  Uterus: Small, mobile, no parametrial involvement or nodularity.  Adnexa: No palpable masses. Rectal  Good tone, no masses no cul de sac nodularity.  Extremities  No bilateral cyanosis, clubbing or edema.   Laurette Schimke, MD, PhD 09/26/2012, 11:53 AM

## 2012-10-02 ENCOUNTER — Telehealth: Payer: Self-pay | Admitting: *Deleted

## 2012-10-02 NOTE — Telephone Encounter (Signed)
After receiving MyChart message from patient regarding proceeding with 10-10-12 procedure, I contacted patient to follow up.  Due to previous referral to Dr Nelly Rout, no further surgical plans were made with Dr Edward Jolly.  Patient saw Dr Nelly Rout as scheduled and they agree to proceed with surgical procedure as Dr Edward Jolly previously recommended and patient would like to schedule. Advised with Dr Edward Jolly out of the office, unable to review with her and get preop appt scheduled for 10-10-12 surgery date.  Patient is agreaable to delay to the following week so Dr Edward Jolly can review on 10-09-12.  Will schedule case and call her back.  Epic request sent to post case on 10-17-12.

## 2012-10-04 NOTE — Telephone Encounter (Signed)
Patient is asking for surgery date

## 2012-10-05 NOTE — Telephone Encounter (Signed)
Left message for patient to call me back relative to her privacy and security concerns about use of MyChart.

## 2012-10-05 NOTE — Telephone Encounter (Signed)
Patient calling to confirm date of surgery.  Advised that surgery is scheduled for 10-18-12, instructions given and pre/post op appts scheduled. Written surgery instruction sheet mailed.  Patient asking if Shaklee antioxidants are still allowed or if considered herbal.  Advised will review with Dr Edward Jolly for instruction.  Patient expressed concern over her experience with My Chart that notified her of Oncology appt prior to d Silva's call. Patient feels that "software" that [facility] is using has allowed her to begin receiving calls from pain management clinics,life insurance companies and funeral services.  She states that this could be a HIPPA issue.  She attempted to call the phone number listed on the MyChart page to discuss her concerns and got no answer.  Advised that administrator was notified of and reported the events that happened that notified her of the oncology appt and I will refer this info to her for further discussion.

## 2012-10-09 NOTE — Telephone Encounter (Signed)
Kennon Rounds,  Thank you for scheduling the surgery for this patient.  Please disregard the email I just sent you regarding scheduling of her surgery.  I have noted the patients concerns and will pass them along to administration of [place].  Conley Simmonds, M.D.

## 2012-10-10 ENCOUNTER — Encounter (HOSPITAL_COMMUNITY): Payer: Self-pay

## 2012-10-11 ENCOUNTER — Other Ambulatory Visit: Payer: Self-pay

## 2012-10-11 ENCOUNTER — Other Ambulatory Visit: Payer: Self-pay | Admitting: Obstetrics and Gynecology

## 2012-10-12 ENCOUNTER — Ambulatory Visit (INDEPENDENT_AMBULATORY_CARE_PROVIDER_SITE_OTHER): Payer: BC Managed Care – PPO | Admitting: Obstetrics and Gynecology

## 2012-10-12 ENCOUNTER — Encounter (HOSPITAL_COMMUNITY)
Admission: RE | Admit: 2012-10-12 | Discharge: 2012-10-12 | Disposition: A | Discharge status: Home / Self Care | Payer: BC Managed Care – PPO | Source: Ambulatory Visit | Attending: Obstetrics and Gynecology | Admitting: Obstetrics and Gynecology

## 2012-10-12 ENCOUNTER — Encounter (HOSPITAL_COMMUNITY): Payer: Self-pay

## 2012-10-12 ENCOUNTER — Ambulatory Visit: Payer: Self-pay | Admitting: Obstetrics and Gynecology

## 2012-10-12 ENCOUNTER — Encounter: Payer: Self-pay | Admitting: Obstetrics and Gynecology

## 2012-10-12 VITALS — BP 120/68 | HR 64 | Ht 63.94 in | Wt 185.0 lb

## 2012-10-12 DIAGNOSIS — Z1231 Encounter for screening mammogram for malignant neoplasm of breast: Secondary | ICD-10-CM

## 2012-10-12 DIAGNOSIS — Z1239 Encounter for other screening for malignant neoplasm of breast: Secondary | ICD-10-CM

## 2012-10-12 HISTORY — DX: Unspecified asthma, uncomplicated: J45.909

## 2012-10-12 LAB — SURGICAL PCR SCREEN: Staphylococcus aureus: NEGATIVE

## 2012-10-12 LAB — CBC
HCT: 38.5 % (ref 36.0–46.0)
Hemoglobin: 12.7 g/dL (ref 12.0–15.0)
MCHC: 33 g/dL (ref 30.0–36.0)

## 2012-10-12 NOTE — Patient Instructions (Addendum)
20 Daisy [name]  10/12/2012   Your procedure is scheduled on:  10/18/12  Enter through the Main Entrance of [facility] at 11 AM.  Pick up the phone at the desk and dial [phone].   Call this number if you have problems the morning of surgery: [phone]   Remember:   Do not eat food:After Midnight.  Do not drink clear liquids: After Midnight.  Take these medicines the morning of surgery with A SIP OF WATER: Synthroid, Zoloft,    Do not wear jewelry, make-up or nail polish.  Do not wear lotions, powders, or perfumes. You may wear deodorant.  Do not shave 48 hours prior to surgery.  Do not bring valuables to the hospital.  [facility] is not responsible                  for any belongings or valuables brought to the hospital.  Contacts, dentures or bridgework may not be worn into surgery.  Leave suitcase in the car. After surgery it may be brought to your room.  For patients admitted to the hospital, checkout time is 11:00 AM the day of                discharge.   Patients discharged the day of surgery will not be allowed to drive                   home.  Name and phone number of your driver: NA  Special Instructions: Shower using CHG 2 nights before surgery and the night before surgery.  If you shower the day of surgery use CHG.  Use special wash - you have one bottle of CHG for all showers.  You should use approximately 1/3 of the bottle for each shower.   Please read over the following fact sheets that you were given: MRSA Information

## 2012-10-12 NOTE — Progress Notes (Signed)
Patient ID: Daisy [name], female   DOB: 10-Mar-1956, 57 y.o.   MRN: [id number]  57 y.o.   Single    Caucasian   female   [id number]   here for surgical discussion.  Patient initially presented with postmenopausal bleeding. Patient has a thickened endometrium suspicious for an endometrial polyp and has had a negative endometrial biopsy which has not explained the thickening. Ultrasound has also documented the left ovary with a calcified nodule measuring 11 x 9 mm. OVA 1 measured 4.9 Patient had GYN Oncology consultation with Dr. Laurette Schimke who agrees with a minimally invasive approach for hysteroscopic polypectomy and salpingo-oophorectomy. No hysterectomy or staging procedure were deemed necessary at this time due to the low risk of malignancy.  Since the patient's last visit, she has had 2 episodes of spotting.  She is also having hot flashes.  Wants reinitiate HRT in patch form after surgery is done.   Mammogram has not been performed yet.   Patient's last menstrual period was 04/26/2010.          Sexually active: no  The current method of family planning is abstinence.    Exercising:  Last mammogram:  2012 - normal Last pap smear: Nov or Dec 2013 - normal.  Done with PCP. History of abnormal pap: no     Family History  Problem Relation Age of Onset  . Osteoarthritis Mother   . Thyroid disease Mother   . Lung cancer Father     mets to liver/dec'd age 55  . Hypertension Father   . Thyroid disease Maternal Grandmother     Patient Active Problem List   Diagnosis Date Noted  . Thickened endometrium 09/15/2012  . Left ovarian cyst 09/15/2012  . Postmenopausal bleeding 07/13/2012    Past Medical History  Diagnosis Date  . Anxiety   . Depression   . OCD (obsessive compulsive disorder)   . Broken foot 2013    right  . Thyroid disease 1994    hypothyroid--Dr. Fonnie Birkenhead  . Vitamin D deficiency     Past Surgical History  Procedure Laterality Date  .  Tonsillectomy and adenoidectomy      age 68    Allergies: Penicillins; Sudafed; Thiram; and Thimerosal  Current Outpatient Prescriptions  Medication Sig Dispense Refill  . Albuterol (VENTOLIN IN) Inhale 2 puffs into the lungs every 4 (four) hours as needed. Wheezing/coughing during respiratory illness      . Cholecalciferol (VITAMIN D3) 2000 UNITS capsule Take 2,000 Units by mouth daily.       . fexofenadine (ALLEGRA) 180 MG tablet Take 180 mg by mouth daily as needed. During allergy flare      . fluticasone (VERAMYST) 27.5 MCG/SPRAY nasal spray Place 2 sprays into the nose daily as needed for rhinitis. During allergy flare      . Fluticasone-Salmeterol (ADVAIR) 250-50 MCG/DOSE AEPB Inhale 1 puff into the lungs daily as needed. During respiratory illness or allergy flare      . levothyroxine (SYNTHROID, LEVOTHROID) 125 MCG tablet Take 125 mcg by mouth daily before breakfast.      . LORazepam (ATIVAN) 1 MG tablet Take 1 tablet by mouth 2 (two) times daily.      . Multiple Vitamins-Minerals (MULTIVITAMIN PO) Take 1 tablet by mouth daily.      Marland Kitchen OVER THE COUNTER MEDICATION Take 1 capsule by mouth daily. Shaklee antioxidants/flavanoids with grape seed extract and green tea      . PREMPRO 0.3-1.5 MG per tablet Take 1  tablet by mouth daily.      . sertraline (ZOLOFT) 100 MG tablet Take 1.5 tablets by mouth daily.      . vitamin C (ASCORBIC ACID) 500 MG tablet Take 500 mg by mouth daily.       No current facility-administered medications for this visit.    ROS: Pertinent items are noted in HPI.  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  Exam:    BP 120/68  Pulse 64  Ht 5' 3.94" (1.624 m)  Wt 185 lb (83.915 kg)  BMI 31.82 kg/m2  LMP 04/26/2010   Wt Readings from Last 3 Encounters:  10/12/12 185 lb (83.915 kg)  09/26/12 183 lb 9.6 oz (83.28 kg)  07/28/12 184 lb (83.462 kg)     Ht Readings from Last 3 Encounters:  10/12/12 5' 3.94" (1.624 m)  09/26/12 5' 3.94" (1.624 m)   07/13/12 5' 3.75" (1.619 m)    General appearance: alert, cooperative and appears stated age Head: Normocephalic, without obvious abnormality, atraumatic Neck: no adenopathy, supple, symmetrical, trachea midline and thyroid not enlarged, symmetric, no tenderness/mass/nodules Lungs: clear to auscultation bilaterally Breasts: Inspection negative, No nipple retraction or dimpling, No nipple discharge or bleeding, No axillary or supraclavicular adenopathy, Normal to palpation without dominant masses Heart: regular rate and rhythm Abdomen: soft, non-tender; no masses,  no organomegaly Extremities: extremities normal, atraumatic, no cyanosis or edema Skin: Skin color, texture, turgor normal. No rashes or lesions Lymph nodes: Cervical, supraclavicular, and axillary nodes normal. No abnormal inguinal nodes palpated Neurologic: Grossly normal   Pelvic: External genitalia:  no lesions              Urethra:  normal appearing urethra with no masses, tenderness or lesions              Bartholins and Skenes: normal                 Vagina: normal appearing vagina with normal color and discharge, no lesions              Cervix: normal appearance              Pap taken: no        Bimanual Exam:  Uterus:  uterus is normal size, shape, consistency and nontender                                      Adnexa: normal adnexa in size, nontender and no masses                                      Rectovaginal: Confirms                                      Anus:  normal sphincter tone, no lesions  Assessment: Postmenopausal bleeding. Thickened endometrium with suspected endometrial polyp. Calcified left ovarian mass. Elevated OVA1. Need for mammogram.     Plan: Mammogram at [facility].  Order placed.  Patient will call to schedule. Proceed with hysteroscopic polypectomy with dilation and curettage, laparoscopic bilateral salpingo-oophorectomy at [facility] next week. Risks, benefits, and  alternatives have been discussed with the patient who wishes to proceed. Patient will pretreat with Cytotec 200 mcg po the  evening prior to her procedure.  return annually or prn     An After Visit Summary was printed and given to the patient.

## 2012-10-14 ENCOUNTER — Encounter: Payer: Self-pay | Admitting: Obstetrics and Gynecology

## 2012-10-16 NOTE — Progress Notes (Signed)
See next phone note regarding surgery schedule.

## 2012-10-17 MED ORDER — METRONIDAZOLE IN NACL 5-0.79 MG/ML-% IV SOLN
500.0000 mg | INTRAVENOUS | Status: AC
  Administered 2012-10-18: 500 g via INTRAVENOUS
  Filled 2012-10-17: qty 100

## 2012-10-17 MED ORDER — CIPROFLOXACIN IN D5W 400 MG/200ML IV SOLN
400.0000 mg | INTRAVENOUS | Status: AC
  Administered 2012-10-18: 400 mg via INTRAVENOUS
  Filled 2012-10-17: qty 200

## 2012-10-18 ENCOUNTER — Ambulatory Visit (HOSPITAL_COMMUNITY): Payer: BC Managed Care – PPO | Admitting: Anesthesiology

## 2012-10-18 ENCOUNTER — Ambulatory Visit (HOSPITAL_COMMUNITY)
Admission: RE | Admit: 2012-10-18 | Discharge: 2012-10-18 | Disposition: A | Discharge status: Home / Self Care | Payer: BC Managed Care – PPO | Source: Ambulatory Visit | Attending: Obstetrics and Gynecology | Admitting: Obstetrics and Gynecology

## 2012-10-18 ENCOUNTER — Encounter (HOSPITAL_COMMUNITY)
Admission: RE | Disposition: A | Discharge status: Home / Self Care | Payer: Self-pay | Source: Ambulatory Visit | Attending: Obstetrics and Gynecology

## 2012-10-18 ENCOUNTER — Encounter (HOSPITAL_COMMUNITY): Payer: Self-pay | Admitting: Anesthesiology

## 2012-10-18 ENCOUNTER — Telehealth: Payer: Self-pay | Admitting: *Deleted

## 2012-10-18 DIAGNOSIS — N950 Postmenopausal bleeding: Secondary | ICD-10-CM

## 2012-10-18 DIAGNOSIS — N731 Chronic parametritis and pelvic cellulitis: Secondary | ICD-10-CM

## 2012-10-18 DIAGNOSIS — R9389 Abnormal findings on diagnostic imaging of other specified body structures: Secondary | ICD-10-CM

## 2012-10-18 DIAGNOSIS — R6889 Other general symptoms and signs: Secondary | ICD-10-CM

## 2012-10-18 DIAGNOSIS — N840 Polyp of corpus uteri: Secondary | ICD-10-CM | POA: Insufficient documentation

## 2012-10-18 DIAGNOSIS — N83209 Unspecified ovarian cyst, unspecified side: Secondary | ICD-10-CM | POA: Insufficient documentation

## 2012-10-18 HISTORY — PX: SALPINGOOPHORECTOMY: SHX82

## 2012-10-18 HISTORY — PX: LAPAROSCOPY: SHX197

## 2012-10-18 HISTORY — PX: DILATATION & CURRETTAGE/HYSTEROSCOPY WITH RESECTOCOPE: SHX5572

## 2012-10-18 LAB — URINALYSIS, ROUTINE W REFLEX MICROSCOPIC
Bilirubin Urine: NEGATIVE
Glucose, UA: NEGATIVE mg/dL
Ketones, ur: NEGATIVE mg/dL
Leukocytes, UA: NEGATIVE
Nitrite: NEGATIVE
Protein, ur: NEGATIVE mg/dL
Specific Gravity, Urine: 1.02 (ref 1.005–1.030)
Urobilinogen, UA: 0.2 mg/dL (ref 0.0–1.0)
pH: 6.5 (ref 5.0–8.0)

## 2012-10-18 LAB — BASIC METABOLIC PANEL WITH GFR
BUN: 18 mg/dL (ref 6–23)
CO2: 29 meq/L (ref 19–32)
Calcium: 9.7 mg/dL (ref 8.4–10.5)
Chloride: 105 meq/L (ref 96–112)
Creatinine, Ser: 0.77 mg/dL (ref 0.50–1.10)
GFR calc Af Amer: >90 mL/min
GFR calc non Af Amer: >90 mL/min
Glucose, Bld: 104 mg/dL — ABNORMAL HIGH (ref 70–99)
Potassium: 3.9 meq/L (ref 3.5–5.1)
Sodium: 144 meq/L (ref 135–145)

## 2012-10-18 LAB — BASIC METABOLIC PANEL
GFR calc Af Amer: >90 mL/min (ref >90)
GFR calc non Af Amer: >90 mL/min (ref >90)
Potassium: 3.8 mEq/L (ref 3.5–5.1)
Sodium: 139 mEq/L (ref 135–145)

## 2012-10-18 LAB — URINE MICROSCOPIC-ADD ON

## 2012-10-18 SURGERY — LAPAROSCOPY OPERATIVE
Anesthesia: General

## 2012-10-18 MED ORDER — OXYCODONE-ACETAMINOPHEN 5-325 MG PO TABS: 2.0000 | ORAL_TABLET | ORAL | Status: DC | PRN

## 2012-10-18 MED ORDER — LIDOCAINE HCL 1 % IJ SOLN
INTRAMUSCULAR | Status: DC | PRN
  Administered 2012-10-18: 10 mL

## 2012-10-18 MED ORDER — NEOSTIGMINE METHYLSULFATE 1 MG/ML IJ SOLN
INTRAMUSCULAR | Status: DC | PRN
  Administered 2012-10-18: 3 mg via INTRAVENOUS

## 2012-10-18 MED ORDER — NEOSTIGMINE METHYLSULFATE 1 MG/ML IJ SOLN
INTRAMUSCULAR | Status: AC
  Filled 2012-10-18: qty 1

## 2012-10-18 MED ORDER — BUPIVACAINE HCL (PF) 0.25 % IJ SOLN
INTRAMUSCULAR | Status: DC | PRN
  Administered 2012-10-18: 9 mL

## 2012-10-18 MED ORDER — EPHEDRINE SULFATE 50 MG/ML IJ SOLN
INTRAMUSCULAR | Status: DC | PRN
  Administered 2012-10-18 (×2): 10 mg via INTRAVENOUS

## 2012-10-18 MED ORDER — HYDROMORPHONE HCL PF 1 MG/ML IJ SOLN
INTRAMUSCULAR | Status: AC
  Filled 2012-10-18: qty 1

## 2012-10-18 MED ORDER — MIDAZOLAM HCL 2 MG/2ML IJ SOLN
INTRAMUSCULAR | Status: AC
  Filled 2012-10-18: qty 2

## 2012-10-18 MED ORDER — ALBUTEROL SULFATE HFA 108 (90 BASE) MCG/ACT IN AERS
INHALATION_SPRAY | RESPIRATORY_TRACT | Status: AC
  Filled 2012-10-18: qty 6.7

## 2012-10-18 MED ORDER — LORAZEPAM 1 MG PO TABS: 1.0000 mg | ORAL_TABLET | Freq: Two times a day (BID) | ORAL | Status: DC

## 2012-10-18 MED ORDER — LACTATED RINGERS IV SOLN
INTRAVENOUS | Status: DC
  Administered 2012-10-18 (×2): via INTRAVENOUS

## 2012-10-18 MED ORDER — INDIGOTINDISULFONATE SODIUM 8 MG/ML IJ SOLN
INTRAMUSCULAR | Status: DC | PRN
  Administered 2012-10-18: 4 mL via INTRAVENOUS

## 2012-10-18 MED ORDER — PROPOFOL 10 MG/ML IV EMUL
INTRAVENOUS | Status: AC
  Filled 2012-10-18: qty 20

## 2012-10-18 MED ORDER — GLYCOPYRROLATE 0.2 MG/ML IJ SOLN
INTRAMUSCULAR | Status: AC
  Filled 2012-10-18: qty 2

## 2012-10-18 MED ORDER — GLYCOPYRROLATE 0.2 MG/ML IJ SOLN
INTRAMUSCULAR | Status: DC | PRN
  Administered 2012-10-18: .4 mg via INTRAVENOUS

## 2012-10-18 MED ORDER — LIDOCAINE HCL (CARDIAC) 20 MG/ML IV SOLN
INTRAVENOUS | Status: DC | PRN
  Administered 2012-10-18: 50 mg via INTRAVENOUS

## 2012-10-18 MED ORDER — LACTATED RINGERS IV SOLN: INTRAVENOUS | Status: DC

## 2012-10-18 MED ORDER — EPHEDRINE 5 MG/ML INJ
INTRAVENOUS | Status: AC
  Filled 2012-10-18: qty 10

## 2012-10-18 MED ORDER — LACTATED RINGERS IR SOLN
Status: DC | PRN
  Administered 2012-10-18: 3000 mL

## 2012-10-18 MED ORDER — ONDANSETRON HCL 4 MG/2ML IJ SOLN
INTRAMUSCULAR | Status: DC | PRN
  Administered 2012-10-18: 4 mg via INTRAVENOUS

## 2012-10-18 MED ORDER — FENTANYL CITRATE 0.05 MG/ML IJ SOLN: 25.0000 ug | INTRAMUSCULAR | Status: DC | PRN

## 2012-10-18 MED ORDER — ROCURONIUM BROMIDE 50 MG/5ML IV SOLN
INTRAVENOUS | Status: AC
  Filled 2012-10-18: qty 1

## 2012-10-18 MED ORDER — FENTANYL CITRATE 0.05 MG/ML IJ SOLN
INTRAMUSCULAR | Status: AC
  Filled 2012-10-18: qty 5

## 2012-10-18 MED ORDER — GLYCINE 1.5 % IR SOLN
Status: DC | PRN
  Administered 2012-10-18: 3000 mL

## 2012-10-18 MED ORDER — IBUPROFEN 800 MG PO TABS: 800.0000 mg | ORAL_TABLET | Freq: Three times a day (TID) | ORAL | Status: AC | PRN

## 2012-10-18 MED ORDER — KETOROLAC TROMETHAMINE 30 MG/ML IJ SOLN
INTRAMUSCULAR | Status: DC | PRN
  Administered 2012-10-18: 30 mg via INTRAVENOUS

## 2012-10-18 MED ORDER — ONDANSETRON HCL 4 MG/2ML IJ SOLN
INTRAMUSCULAR | Status: AC
  Filled 2012-10-18: qty 2

## 2012-10-18 MED ORDER — MIDAZOLAM HCL 5 MG/5ML IJ SOLN
INTRAMUSCULAR | Status: DC | PRN
  Administered 2012-10-18: 2 mg via INTRAVENOUS

## 2012-10-18 MED ORDER — PROPOFOL 10 MG/ML IV BOLUS
INTRAVENOUS | Status: DC | PRN
  Administered 2012-10-18: 200 mg via INTRAVENOUS
  Administered 2012-10-18 (×2): 50 mg via INTRAVENOUS

## 2012-10-18 MED ORDER — LIDOCAINE HCL (CARDIAC) 20 MG/ML IV SOLN
INTRAVENOUS | Status: AC
  Filled 2012-10-18: qty 5

## 2012-10-18 MED ORDER — ROCURONIUM BROMIDE 100 MG/10ML IV SOLN
INTRAVENOUS | Status: DC | PRN
  Administered 2012-10-18: 40 mg via INTRAVENOUS
  Administered 2012-10-18 (×2): 5 mg via INTRAVENOUS
  Administered 2012-10-18 (×3): 10 mg via INTRAVENOUS

## 2012-10-18 MED ORDER — FENTANYL CITRATE 0.05 MG/ML IJ SOLN
INTRAMUSCULAR | Status: DC | PRN
  Administered 2012-10-18: 50 ug via INTRAVENOUS
  Administered 2012-10-18 (×2): 100 ug via INTRAVENOUS

## 2012-10-18 MED ORDER — INDIGOTINDISULFONATE SODIUM 8 MG/ML IJ SOLN
INTRAMUSCULAR | Status: AC
  Filled 2012-10-18: qty 5

## 2012-10-18 MED ORDER — HYDROMORPHONE HCL PF 1 MG/ML IJ SOLN
INTRAMUSCULAR | Status: DC | PRN
  Administered 2012-10-18 (×2): .5 mg via INTRAVENOUS

## 2012-10-18 SURGICAL SUPPLY — 35 items
BARRIER ADHS 3X4 INTERCEED (GAUZE/BANDAGES/DRESSINGS) IMPLANT
BENZOIN TINCTURE PRP APPL 2/3 (GAUZE/BANDAGES/DRESSINGS) IMPLANT
CABLE HIGH FREQUENCY MONO STRZ (ELECTRODE) IMPLANT
CANISTER SUCTION 2500CC (MISCELLANEOUS) ×4 IMPLANT
CATH ROBINSON RED A/P 16FR (CATHETERS) ×4 IMPLANT
CLOTH BEACON ORANGE TIMEOUT ST (SAFETY) ×4 IMPLANT
CONTAINER PREFILL 10% NBF 60ML (FORM) ×16 IMPLANT
DERMABOND ADVANCED (GAUZE/BANDAGES/DRESSINGS) ×1
DERMABOND ADVANCED .7 DNX12 (GAUZE/BANDAGES/DRESSINGS) ×3 IMPLANT
DRESSING TELFA 8X3 (GAUZE/BANDAGES/DRESSINGS) ×4 IMPLANT
ELECT REM PT RETURN 9FT ADLT (ELECTROSURGICAL) ×4
ELECTRODE REM PT RTRN 9FT ADLT (ELECTROSURGICAL) ×3 IMPLANT
FORCEPS CUTTING 33CM 5MM (CUTTING FORCEPS) ×4 IMPLANT
GLOVE BIO SURGEON STRL SZ 6.5 (GLOVE) ×20 IMPLANT
GOWN PREVENTION PLUS LG XLONG (DISPOSABLE) ×8 IMPLANT
GOWN STRL REIN XL XLG (GOWN DISPOSABLE) ×8 IMPLANT
LOOP ANGLED CUTTING 22FR (CUTTING LOOP) ×4 IMPLANT
NEEDLE SPNL 20GX3.5 QUINCKE YW (NEEDLE) ×4 IMPLANT
NS IRRIG 1000ML POUR BTL (IV SOLUTION) ×4 IMPLANT
PACK HYSTEROSCOPY LF (CUSTOM PROCEDURE TRAY) ×4 IMPLANT
PACK LAPAROSCOPY BASIN (CUSTOM PROCEDURE TRAY) ×4 IMPLANT
PAD OB MATERNITY 4.3X12.25 (PERSONAL CARE ITEMS) ×4 IMPLANT
POUCH SPECIMEN RETRIEVAL 10MM (ENDOMECHANICALS) ×8 IMPLANT
PROTECTOR NERVE ULNAR (MISCELLANEOUS) ×8 IMPLANT
SCISSORS LAP 5X35 DISP (ENDOMECHANICALS) IMPLANT
SET IRRIG TUBING LAPAROSCOPIC (IRRIGATION / IRRIGATOR) ×4 IMPLANT
SOLUTION ELECTROLUBE (MISCELLANEOUS) IMPLANT
STRIP CLOSURE SKIN 1/4X3 (GAUZE/BANDAGES/DRESSINGS) IMPLANT
SUT PLAIN 3 0 FS 2 27 (SUTURE) ×4 IMPLANT
SUT VICRYL 0 UR6 27IN ABS (SUTURE) ×4 IMPLANT
TOWEL OR 17X24 6PK STRL BLUE (TOWEL DISPOSABLE) ×8 IMPLANT
TRAY FOLEY CATH 14FR (SET/KITS/TRAYS/PACK) ×4 IMPLANT
TROCAR XCEL DIL TIP R 11M (ENDOMECHANICALS) ×4 IMPLANT
WARMER LAPAROSCOPE (MISCELLANEOUS) ×4 IMPLANT
WATER STERILE IRR 1000ML POUR (IV SOLUTION) ×4 IMPLANT

## 2012-10-18 NOTE — Telephone Encounter (Signed)
Returning call to patient her call form business office regarding surgical cost and payment arrangements..  Patient was VERY upset and called back to speak with administrator who is not here so she left a VM.  I called patient back to clarify situation.  Patient answered phone and is audibly crying (sobbing).  Asking patient what is wrong since crying seems out of proportion to the financial call.  Patient is unable to answer me for crying.  Explained that I  instructed billing office to call her since it would not be fair to notify her of balance after the case was completed.  Apologized for the call but since we had scheduled this case so quickly (had been referred to Dr Nelly Rout) billing office had not had the chance to call her and we just wanted her to know about it and need to make some sort of plan for payment but we would work with her to work that out.  Patient gives phone to her mother because she is crying so much she cant talk.  Explained this all over again to her mother.  Advised that we will work out payment plan and she can call next week.  Mother then states for me not to call her back and I answered by explaining that if patient doesn't call next week, we would have to call her back.  Dr Edward Jolly notified of status.

## 2012-10-18 NOTE — Transfer of Care (Signed)
Immediate Anesthesia Transfer of Care Note  Patient: Daisy [name]  Procedure(s) Performed: Procedure(s) with comments: LEFT S&O (Left) - PELVIC WASHING DILATATION & CURETTAGE/HYSTEROSCOPY WITH RESECTOCOPE (N/A) SALPINGO OOPHORECTOMY (Bilateral)  Patient Location: PACU  Anesthesia Type:General  Level of Consciousness: awake, alert  and sedated  Airway & Oxygen Therapy: Patient Spontanous Breathing and Patient connected to nasal cannula oxygen  Post-op Assessment: Report given to PACU RN and Post -op Vital signs reviewed and stable  Post vital signs: stable  Complications: No apparent anesthesia complications

## 2012-10-18 NOTE — OR Nursing (Signed)
LOA NEEDS TO BE ADDED TO PROCEDURE FOR THIS CASE CASE LOCKED BY SAM

## 2012-10-18 NOTE — H&P (Signed)
Patient ID: Daisy [name], female   DOB: March 06, 1956, 57 y.o.   MRN: [id number]  57 y.o.   Single    Caucasian   female    G0P0   here for surgical discussion.  Patient initially presented with postmenopausal bleeding. Patient has a thickened endometrium suspicious for an endometrial polyp and has had a negative endometrial biopsy which has not explained the thickening. Ultrasound has also documented the left ovary with a calcified nodule measuring 11 x 9 mm. OVA 1 measured 4.9 Patient had GYN Oncology consultation with Dr. Laurette Schimke who agrees with a minimally invasive approach for hysteroscopic polypectomy and salpingo-oophorectomy. No hysterectomy or staging procedure were deemed necessary at this time due to the low risk of malignancy.  Since the patient's last visit, she has had 2 episodes of spotting.  She is also having hot flashes.  Wants reinitiate HRT in patch form after surgery is done.   Mammogram has not been performed yet.   Patient's last menstrual period was 04/26/2010.           Sexually active: no   The current method of family planning is abstinence.     Exercising:   Last mammogram:  2012 - normal Last pap smear: Nov or Dec 2013 - normal.  Done with PCP. History of abnormal pap: no       Family History   Problem  Relation  Age of Onset   .  Osteoarthritis  Mother     .  Thyroid disease  Mother     .  Lung cancer  Father         mets to liver/dec'd age 57   .  Hypertension  Father     .  Thyroid disease  Maternal Grandmother         Patient Active Problem List     Diagnosis  Date Noted   .  Thickened endometrium  09/15/2012   .  Left ovarian cyst  09/15/2012   .  Postmenopausal bleeding  07/13/2012       Past Medical History   Diagnosis  Date   .  Anxiety     .  Depression     .  OCD (obsessive compulsive disorder)     .  Broken foot  2013       right   .  Thyroid disease  1994       hypothyroid--Dr. Fonnie Birkenhead   .  Vitamin D  deficiency         Past Surgical History   Procedure  Laterality  Date   .  Tonsillectomy and adenoidectomy           age 57     Allergies: Penicillins; Sudafed; Thiram; and Thimerosal    Current Outpatient Prescriptions   Medication  Sig  Dispense  Refill   .  Albuterol (VENTOLIN IN)  Inhale 2 puffs into the lungs every 4 (four) hours as needed. Wheezing/coughing during respiratory illness         .  Cholecalciferol (VITAMIN D3) 2000 UNITS capsule  Take 2,000 Units by mouth daily.          .  fexofenadine (ALLEGRA) 180 MG tablet  Take 180 mg by mouth daily as needed. During allergy flare         .  fluticasone (VERAMYST) 27.5 MCG/SPRAY nasal spray  Place 2 sprays into the nose daily as needed for rhinitis. During allergy flare         .  Fluticasone-Salmeterol (ADVAIR) 250-50 MCG/DOSE AEPB  Inhale 1 puff into the lungs daily as needed. During respiratory illness or allergy flare         .  levothyroxine (SYNTHROID, LEVOTHROID) 125 MCG tablet  Take 125 mcg by mouth daily before breakfast.         .  LORazepam (ATIVAN) 1 MG tablet  Take 1 tablet by mouth 2 (two) times daily.         .  Multiple Vitamins-Minerals (MULTIVITAMIN PO)  Take 1 tablet by mouth daily.         Marland Kitchen  OVER THE COUNTER MEDICATION  Take 1 capsule by mouth daily. Shaklee antioxidants/flavanoids with grape seed extract and green tea         .  PREMPRO 0.3-1.5 MG per tablet  Take 1 tablet by mouth daily.         .  sertraline (ZOLOFT) 100 MG tablet  Take 1.5 tablets by mouth daily.         .  vitamin C (ASCORBIC ACID) 500 MG tablet  Take 500 mg by mouth daily.            No current facility-administered medications for this visit.     ROS: Pertinent items are noted in HPI.  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  Exam:    BP 120/68  Pulse 64  Ht 5' 3.94" (1.624 m)  Wt 185 lb (83.915 kg)  BMI 31.82 kg/m2  LMP 04/26/2010    Wt Readings from Last 3 Encounters:   10/12/12  185 lb (83.915 kg)   09/26/12   183 lb 9.6 oz (83.28 kg)   07/28/12  184 lb (83.462 kg)       Ht Readings from Last 3 Encounters:   10/12/12  5' 3.94" (1.624 m)   09/26/12  5' 3.94" (1.624 m)   07/13/12  5' 3.75" (1.619 m)     General appearance: alert, cooperative and appears stated age Head: Normocephalic, without obvious abnormality, atraumatic Neck: no adenopathy, supple, symmetrical, trachea midline and thyroid not enlarged, symmetric, no tenderness/mass/nodules Lungs: clear to auscultation bilaterally Breasts: Inspection negative, No nipple retraction or dimpling, No nipple discharge or bleeding, No axillary or supraclavicular adenopathy, Normal to palpation without dominant masses Heart: regular rate and rhythm Abdomen: soft, non-tender; no masses,  no organomegaly Extremities: extremities normal, atraumatic, no cyanosis or edema Skin: Skin color, texture, turgor normal. No rashes or lesions Lymph nodes: Cervical, supraclavicular, and axillary nodes normal. No abnormal inguinal nodes palpated Neurologic: Grossly normal   Pelvic: External genitalia:  no lesions              Urethra:  normal appearing urethra with no masses, tenderness or lesions              Bartholins and Skenes: normal                  Vagina: normal appearing vagina with normal color and discharge, no lesions              Cervix: normal appearance              Pap taken: no        Bimanual Exam:  Uterus:  uterus is normal size, shape, consistency and nontender  Adnexa: normal adnexa in size, nontender and no masses                                      Rectovaginal: Confirms                                      Anus:  normal sphincter tone, no lesions  Assessment: Postmenopausal bleeding. Thickened endometrium with suspected endometrial polyp. Calcified left ovarian mass. Elevated OVA1. Need for mammogram.     Plan: Mammogram at [facility].  Order placed.  Patient will call to  schedule. Proceed with hysteroscopic polypectomy with dilation and curettage, laparoscopic bilateral salpingo-oophorectomy, collection of pelvic washings at [facility] next week. Risks, benefits, and alternatives have been discussed with the patient who wishes to proceed.    return annually or prn

## 2012-10-18 NOTE — Brief Op Note (Signed)
10/18/2012  4:13 PM  PATIENT:  Daisy [name]  57 y.o. female  PRE-OPERATIVE DIAGNOSIS:  Thickened endometrium and elevated OVA1 CPT [phone]  POST-OPERATIVE DIAGNOSIS:  Thickened endometrium and elevated OVA1  PROCEDURE:  Procedure(s) with comments: LEFT S&O (Left) - PELVIC WASHING DILATATION & CURETTAGE/HYSTEROSCOPY WITH RESECTOCOPE (N/A) SALPINGO OOPHORECTOMY (Bilateral) LYSIS OF ADHESIONS  SURGEON:  Surgeon(s) and Role:    * Melony Overly, MD - Primary    * Bennye Alm, MD - Assisting  PHYSICIAN ASSISTANT:   ASSISTANTS: Douglass Rivers, MD   ANESTHESIA:   general, paracervical block with 1 % Lidocaine, .25% Marcaine  EBL:  Total I/O In: 1800 [I.V.:1800] Out: 510 [Urine:500; Blood:10]  BLOOD ADMINISTERED:none  DRAINS: none   LOCAL MEDICATIONS USED:  LIDOCAINE   SPECIMEN:  Source of Specimen:  Endometrial polyps, endometrial curettings, right tube and ovary, left tube and ovary, pelvic washings  DISPOSITION OF SPECIMEN:  PATHOLOGY  COUNTS:  YES  TOURNIQUET:  * No tourniquets in log *  DICTATION: .Other Dictation: Dictation Number  no number noted  PLAN OF CARE: Discharge to home after PACU  PATIENT DISPOSITION:  PACU - hemodynamically stable.   Delay start of Pharmacological VTE agent (>24hrs) due to surgical blood loss or risk of bleeding: not applicable

## 2012-10-18 NOTE — Anesthesia Preprocedure Evaluation (Signed)
Anesthesia Evaluation  Patient identified by MRN, date of birth, ID band Patient awake    Reviewed: Allergy & Precautions, H&P , Patient's Chart, lab work & pertinent test results, reviewed documented beta blocker date and time   Airway Mallampati: II TM Distance: >3 FB Neck ROM: full    Dental no notable dental hx.    Pulmonary  breath sounds clear to auscultation  Pulmonary exam normal       Cardiovascular Rhythm:regular Rate:Normal     Neuro/Psych    GI/Hepatic   Endo/Other    Renal/GU      Musculoskeletal   Abdominal   Peds  Hematology   Anesthesia Other Findings   Reproductive/Obstetrics                           Anesthesia Physical Anesthesia Plan  ASA: II  Anesthesia Plan: General   Post-op Pain Management:    Induction: Intravenous  Airway Management Planned: Oral ETT  Additional Equipment:   Intra-op Plan:   Post-operative Plan:   Informed Consent: I have reviewed the patients History and Physical, chart, labs and discussed the procedure including the risks, benefits and alternatives for the proposed anesthesia with the patient or authorized representative who has indicated his/her understanding and acceptance.   Dental Advisory Given  Plan Discussed with: CRNA and Surgeon  Anesthesia Plan Comments: (  Discussed  general anesthesia, including possible nausea, instrumentation of airway, sore throat,pulmonary aspiration, etc. I asked if the were any outstanding questions, or  concerns before we proceeded. )        Anesthesia Quick Evaluation

## 2012-10-18 NOTE — Anesthesia Postprocedure Evaluation (Signed)
  Anesthesia Post-op Note  Patient: Daisy [name]  Procedure(s) Performed: Procedure(s) with comments: LEFT S&O (Left) - PELVIC WASHING DILATATION & CURETTAGE/HYSTEROSCOPY WITH RESECTOCOPE (N/A) SALPINGO OOPHORECTOMY (Bilateral)  Patient Location: PACU  Anesthesia Type:General  Level of Consciousness: awake, alert  and oriented  Airway and Oxygen Therapy: Patient Spontanous Breathing  Post-op Pain: none  Post-op Assessment: Post-op Vital signs reviewed, Patient's Cardiovascular Status Stable, Respiratory Function Stable, Patent Airway, No signs of Nausea or vomiting and Pain level controlled  Post-op Vital Signs: Reviewed and stable  Complications: No apparent anesthesia complications

## 2012-10-18 NOTE — Progress Notes (Signed)
Update H and P  Patient examined.  Wishes to proceed with surgery.  OK to proceed.

## 2012-10-19 ENCOUNTER — Encounter: Payer: Self-pay | Admitting: Obstetrics and Gynecology

## 2012-10-19 ENCOUNTER — Telehealth: Payer: Self-pay | Admitting: *Deleted

## 2012-10-19 ENCOUNTER — Encounter (HOSPITAL_COMMUNITY): Payer: Self-pay | Admitting: Obstetrics and Gynecology

## 2012-10-19 NOTE — Telephone Encounter (Signed)
S/P Hysteroscopy/Bilat Salpingo-oophorectomy x 1 day  Pt called this morning after leaving (2) messages in My Chart at 4:14,4:24am  Saying when is it safe to take Ativan and having some insomnia. I spoke with patient to see how she was doing, she said no problems just can't sleep.  She's not having any pain or bleeding having normal urinations still no bowel movement. Has taken (1) motrin 800mg  at 11:00pm And then at 12:00am (1) percocet, and at 1:30am 1/2 percocet has had breakfast this morning, I encouraged her to drinks lots of liquids. Tried calling the answering service did not get anyone, then sent a My Chart Message. Please Advise.

## 2012-10-19 NOTE — Telephone Encounter (Signed)
Phone call returned to patient. Patient concerned about lack of sleep.  Takes Ativan twice a day for treat anxiety and to sleep.   Took last Percocet last night at 01:30 am.  Doesn't have pain. Has a little bit of dry mouth.  Has appointment July 9th for post op visit.   Instruction to patient regarding not mixing the Ativan and the Percocet.  Will take Motrin if needed for pain. Percocet takes 4 - 6 hours to get out of system. Reassurance given regarding her sleep pattern being normal during the post op period. Follow up in two weeks.

## 2012-10-19 NOTE — Telephone Encounter (Signed)
I responded to patient through My Chart already, but ok to call to see if she received the message.  I do not recommend taking narcotic and Ativan together as they have an additive effect.  The Percocet takes about 4 - 6 hours to get our of her system.

## 2012-10-19 NOTE — Op Note (Signed)
NAMELAVREN, [name] NO.:  [id number]  MEDICAL RECORD NO.:  [id number]  LOCATION:  [facility]                          FACILITY:  [facility]  PHYSICIAN:  Randye Lobo, M.D.   DATE OF BIRTH:  23-Aug-1955  DATE OF PROCEDURE:  10/18/2012 DATE OF DISCHARGE:  10/18/2012                              OPERATIVE REPORT   PREOPERATIVE DIAGNOSES: 1. Postmenopausal bleeding. 2. Thickened endometrium. 3. Calcified mass of left ovary. 4. Mildly elevated OVA-1.  POSTOPERATIVE DIAGNOSES: 1. Postmenopausal bleeding. 2. Thickened endometrium. 3. Calcified left ovarian mass. 4. Mildly elevated OVA-1. 5. Pelvic and abdominal adhesions.  PROCEDURES: 1. Hysteroscopic polypectomies. 2. Dilation and curettage. 3. Laparoscopic bilateral salpingo-oophorectomy. 4. Lysis of adhesions.  SURGEON:  Randye Lobo, MD  ASSISTANT:  Ivor Costa. [name], MD  ANESTHESIA:  General endotracheal, local with 0.25% Marcaine to the skin, paracervical block with 1% lidocaine plain.  IV FLUIDS:  1800 mL Ringer's lactate.  ESTIMATED BLOOD LOSS:  10 mL.  URINE OUTPUT:  500 mL.  GLYCINE DEFICIT:  100 mL.  COMPLICATIONS:  None.  INDICATIONS FOR THE PROCEDURE:  The patient is a 57 year old gravida 66 Caucasian female who presented with postmenopausal bleeding.  The patient initially had evidence of a thickened endometrium and underwent an office endometrial biopsy which was negative and benign.  The recommendation was made to proceed with a saline ultrasound and there was a delay in the patient's acceptance of this procedure and therefore a followup pelvic ultrasound was ordered for re-evaluation through my office.  At that time, thickened endometrium was confirmed which was suspicious for an endometrial polyp.  There was also a calcified nodule of the left ovary, measuring 11 x 9 mm.  An OVA-1 measured 4.9.  The patient subsequently had GYN/Oncology consultation with Dr. Laurette Schimke who agreed  with my proceeding with a hysteroscopic polypectomy, and salpingo-oophorectomy as she believed the patient had a benign pathology.  The plan is now made to proceed with a hysteroscopic polypectomy with dilation and curettage, bilateral salpingo- oophorectomy, and pelvic washings.  The patient has declined hysterectomy.  Risks, benefits, and alternatives of the procedure have been reviewed with the patient who wishes to proceed.  Examination under anesthesia revealed a small anteverted mobile uterus. No adnexal masses were appreciated.  At the time of hysteroscopy, the uterine length measured 7.5 cm by an uterine sound in the endometrial cavity.  Hysteroscopy demonstrated a large polyp which filled 2/3rd of the endometrial cavity and was attached with a large and broad base on the posterior right fundus.  There was also a polypoid projection in the left cornual region attached to the posterior uterine fundus.  The tubal ostial region on the right was normal and the left appeared to be scarred over.  Laparoscopy demonstrated adhesions in the abdomen and pelvis.  There was a loop of bowel which was adherent to the left anterior abdominal wall by very filmy adhesions.  This was lysed.  The left ovary was adherent to the left pelvic sidewall.  The left ovary appeared to have a 1-cm cystic region with sebaceous material and it is suspicious for a dermoid cyst.  The right ovary had areas  of fine papillation and possible excrescences over the serosa.  The left ovary also had some similar very fine textured projections coming from it.  The bilateral fallopian tubes were unremarkable.  The uterus demonstrated a 1-cm posterior fundal subserosal fibroid.  The uterine serosa was otherwise unremarkable.  The appendix and upper abdomen including the liver were unremarkable.  There was no ascites fluid upon entry into the peritoneal cavity.  The peritoneum was unremarkable.  There were small  islands of fatty tissue scattered over the sigmoid colon.  The following specimens were sent to Pathology separately.  Endometrial polyps, endometrial curettings, right fallopian tube and ovary, left fallopian tube and ovary, pelvic washings.  DESCRIPTION OF PROCEDURE:  The patient was reidentified in the preoperative hold area.  She did receive IV Flagyl and Cipro for antibiotic prophylaxis.  She received both TED hose and PAS stockings for DVT prophylaxis.  In the operating room, general endotracheal anesthesia was induced and the patient was placed in the dorsal lithotomy position with the arms tucked at the sides.  The abdomen, vagina, and perineum were then sterilely prepped and draped.  A Foley catheter was placed inside the bladder.  An exam under anesthesia was performed. A speculum was placed inside the vagina and a single-tooth tenaculum was placed on the anterior cervical lip.  A paracervical block was performed with 10 mL of 1% lidocaine by injecting at the 5 and 7 o'clock positions.  The uterus was sounded.  The cervix was then dilated with Shawnie Pons dilators to a #21 Pratt dilator.  The diagnostic hysteroscope was then inserted into the endometrial cavity and the findings are as noted above.  The cervix was further dilated to a #29 Pratt dilator.  A polyp forceps was initially used to try to attempt to remove the polyp, but this was not successful.  The resectoscope was therefore used which removed the polyp in pieces.  After the large polyp was removed completely, the resectoscope was inserted under the continuous infusion of glycine.  The small polypoid area in the left cornual region was identified and was resected as well and sent with the other endometrial polyp specimens.  Hemostasis was good at this time.  Sharp curettage was then performed in all 4 quadrants of the endometrial cavity and this specimen was sent to Pathology.  A Hulka tenaculum was then placed inside  the uterine cavity and the vaginal speculum was removed.  Attention was turned to the abdomen where a 1-cm infraumbilical incision was created with a scalpel.  A 10-mm trocar was inserted directly into the peritoneal cavity and the laparoscope confirmed proper placement.  A CO2 pneumoperitoneum was achieved.  A 5-mm incision was created in the right lower quadrant and a 5-mm trocar was then placed under direct visualization of the laparoscope.  A 10-mm incision was then created in the left lower quadrant and the 10-mm trocar was placed under direct visualization of the laparoscope.  An inspection of the pelvic and abdominal organs was performed and the findings are as noted above.  Pelvic washings were performed and collected and sent to Pathology.  The scissors were used to sharply excise the adhesion between the bowel and the left anterior abdominal wall.  The ureter was then identified along the left pelvic sidewall.  Sharp and blunt dissection were used to dissect the left ovary off the left pelvic sidewall.  The left salpingo-oophorectomy was then performed using the Gyrus instrument.  Again, the ureter was identified.  The infundibulopelvic  ligament on that side was triply clamped and cauterized and then was bisected with the Gyrus.  Dissection continued through the broad ligament again using the same instrument for cautery and cutting.  The proximal left fallopian tube was then grasped, cauterized with the Gyrus and bisected.  Last, the left utero-ovarian ligament was grasped, coagulated with the Gyrus instrument and was bisected.  The specimen was freed.  Hemostasis was good, and the left tube and ovary was therefore placed in the posterior cul-de-sac.  The right ureter was then identified and the right salpingo-oophorectomy was performed with similar technique again using the gyrus instrument. Again, hemostasis was good.  The EndoCatch bag was then placed through the left  lower quadrant trocar and the right tube and ovary were placed in the bag and were drawn up and out through the left lower quadrant incision.  The specimen was sent to Pathology.  An 11-mm trocar was then reintroduced into the peritoneal cavity under visualization of the laparoscope.  Another EndoCatch bag was inserted into the peritoneal cavity and the left tube and ovary were placed in the bag and drawn up and out of the peritoneal cavity through the left lower quadrant incision.  The left lower quadrant trocar again was placed one final time under visualization of the laparoscope.  The pelvis was irrigated and suctioned.  There was a small remaining piece of ovary which was adherent to the left pelvic sidewall.  This was removed with sharp dissection with care taken to stay away from the left pelvic sidewall structures.  The pelvis was then irrigated and there was some minor oozing along the left pelvic sidewall where the ovary had been adherent.  This was coagulated superficially with bipolar cautery.  All remaining pedicles were hemostatic.  At this time, the patient received indigo carmine dye IV.  Blue urine was noted in the Foley bag.  The ureters were re-identified along the pelvic sidewalls and were noted to peristalse.  There was no blue dye into the peritoneal cavity.  The lower abdominal trocars were removed under direct visualization of the laparoscope.  The CO2 pneumoperitoneum was released.  The umbilical trocar and the laparoscope were removed simultaneously.  The umbilical incision was closed with 2 through-and-through sutures of 0 Vicryl in the subcutaneous layer.  It was not possible to reach the fascia safely to close that through this small incision.  The left lower quadrant fascial closure was performed with a figure-of-eight suture of 0 Vicryl.  All skin incisions were then closed with a subcuticular suture of 4-0 Vicryl.  The incisions were injected locally  with 0.25% Marcaine.  The incisions were then covered with Dermabond.  The Hulka tenaculum and Foley catheter were removed.  The patient was awakened and extubated.  She was escorted to the recovery room in stable and awake condition.  There were no complications to the procedure.  All needle, instrument, and sponge counts were correct.     Randye Lobo, M.D.     BES/MEDQ  D:  10/18/2012  T:  10/19/2012  Job:  [id number]

## 2012-10-20 ENCOUNTER — Encounter: Payer: Self-pay | Admitting: Obstetrics and Gynecology

## 2012-10-23 ENCOUNTER — Telehealth: Payer: Self-pay | Admitting: Obstetrics and Gynecology

## 2012-10-23 NOTE — Telephone Encounter (Signed)
Patient is able to ride in a car now.  Can drive after one week post op.

## 2012-10-23 NOTE — Telephone Encounter (Signed)
Patient is asking when is she able to ride after having surgery.

## 2012-10-24 NOTE — Telephone Encounter (Signed)
Spoke with pt re: BS advice. Pt appreciative.

## 2012-10-25 ENCOUNTER — Telehealth: Payer: Self-pay | Admitting: Obstetrics and Gynecology

## 2012-10-25 NOTE — Telephone Encounter (Signed)
Phone call to discuss pathology results.  Benign endometrial polyp, benign endometrium, Psammoma bodies of bilateral ovaries, benign para tubal cysts.  Patient states she pulled a muscle in her back  lifting a box.  Patient told not to lift over 10 - 15 pounds at this time.  Advil for muscle aches.  Post op check next week.

## 2012-11-01 ENCOUNTER — Encounter: Payer: Self-pay | Admitting: Obstetrics and Gynecology

## 2012-11-01 ENCOUNTER — Ambulatory Visit (INDEPENDENT_AMBULATORY_CARE_PROVIDER_SITE_OTHER): Payer: BC Managed Care – PPO | Admitting: Obstetrics and Gynecology

## 2012-11-01 ENCOUNTER — Ambulatory Visit: Payer: Self-pay | Admitting: Obstetrics and Gynecology

## 2012-11-01 VITALS — BP 120/78 | HR 80 | Ht 63.94 in | Wt 181.5 lb

## 2012-11-01 DIAGNOSIS — Z9889 Other specified postprocedural states: Secondary | ICD-10-CM

## 2012-11-01 NOTE — Progress Notes (Signed)
Patient ID: Daisy [name], female   DOB: 07-04-1955, 57 y.o.   MRN: [id number]  Subjective  Patient is here for a post op check.  Status post laparoscopic bilateral salpingo-oophorectomy, collection of pelvic washings, and hysteroscopic polypectomy at [facility] on 10/19/12. Bleeding and pain gone. Hot flashes essentially gone.  Restarted PremPro 2 days after surgery.    Has not had mammogram yet.  States she will schedule.    Has a beach trip for next week but not feeling confident to do a long drive with a manual transmission.  Also traveling to [place] in the fall.  Objective  Abdomen -  incisions - intact, soft, nontender, nondistended.  No hepatosplenomegaly or organomegaly.    Pelvic exam - normal external genitalia and urethra. Vaginal exam - some dark brown blood noted. Cervix not easily visualized (Patient with difficulty with pelvic exams.) Bimanual exam - uterus small and nontender.  No adnexal masses or tenderness.  Pathology - benign endometrial polyp, benign endometrium, benign ovaries with bilateral psammoma bodies, bilateral benign paratubal cysts. Benign pelvic washings.   Assessment  Post op - doing well. HRT patient with PrepPro. Overdue for mammogram.  Plan  Patient understand the importance of her mammogram. Her goal is to come off the HRT and I have recommended splitting her pills in half. If there is any significant delay in getting her mammogram, I told her I recommend stopping the hormone therapy all together until this is completed and found to be normal. Annual exam in November.

## 2012-11-01 NOTE — Patient Instructions (Signed)
Decrease your PremPro dosage to 1/2 table every day.  In October, stop the hormone therapy.  I will see for your annual exam in November 2014.  Please schedule your mammogram appointment.

## 2012-11-01 NOTE — Telephone Encounter (Signed)
Dr Edward Jolly took care of this patient.

## 2012-11-20 ENCOUNTER — Encounter: Payer: Self-pay | Admitting: Obstetrics and Gynecology

## 2012-11-26 ENCOUNTER — Encounter: Payer: Self-pay | Admitting: Obstetrics and Gynecology

## 2013-01-05 ENCOUNTER — Encounter: Payer: Self-pay | Admitting: Obstetrics and Gynecology

## 2013-01-09 ENCOUNTER — Ambulatory Visit
Admission: RE | Admit: 2013-01-09 | Discharge: 2013-01-09 | Disposition: A | Discharge status: Home / Self Care | Payer: BC Managed Care – PPO | Source: Ambulatory Visit | Attending: Obstetrics and Gynecology | Admitting: Obstetrics and Gynecology

## 2013-01-09 DIAGNOSIS — Z1231 Encounter for screening mammogram for malignant neoplasm of breast: Secondary | ICD-10-CM

## 2013-01-10 ENCOUNTER — Other Ambulatory Visit: Payer: Self-pay | Admitting: Obstetrics and Gynecology

## 2013-01-10 DIAGNOSIS — R928 Other abnormal and inconclusive findings on diagnostic imaging of breast: Secondary | ICD-10-CM

## 2013-01-12 ENCOUNTER — Encounter: Payer: Self-pay | Admitting: Obstetrics and Gynecology

## 2013-01-14 ENCOUNTER — Other Ambulatory Visit: Payer: Self-pay | Admitting: Obstetrics and Gynecology

## 2013-01-14 DIAGNOSIS — R928 Other abnormal and inconclusive findings on diagnostic imaging of breast: Secondary | ICD-10-CM

## 2013-01-15 ENCOUNTER — Telehealth: Payer: Self-pay | Admitting: Obstetrics and Gynecology

## 2013-01-15 ENCOUNTER — Ambulatory Visit: Payer: Self-pay | Admitting: Obstetrics and Gynecology

## 2013-01-15 NOTE — Progress Notes (Signed)
Placed in Mammo Hold 01/15/13 cm

## 2013-01-15 NOTE — Telephone Encounter (Signed)
Called an left message for patient to call back to reschedule her appt she missed today.

## 2013-01-15 NOTE — Telephone Encounter (Signed)
[name],  I cancelled the patient's appointment and sent a staff message to [facility] yesterday regarding this. The patient should not to be charged for this appointment as I cancelled it.  The patient will reschedule when she has another medical issue resolved.    Thanks.

## 2013-01-30 ENCOUNTER — Ambulatory Visit
Admission: RE | Admit: 2013-01-30 | Discharge: 2013-01-30 | Disposition: A | Discharge status: Home / Self Care | Payer: BC Managed Care – PPO | Source: Ambulatory Visit | Attending: Obstetrics and Gynecology | Admitting: Obstetrics and Gynecology

## 2013-01-30 DIAGNOSIS — R928 Other abnormal and inconclusive findings on diagnostic imaging of breast: Secondary | ICD-10-CM

## 2013-02-05 ENCOUNTER — Encounter: Payer: Self-pay | Admitting: Obstetrics and Gynecology

## 2013-02-05 ENCOUNTER — Other Ambulatory Visit: Payer: Self-pay | Admitting: Obstetrics and Gynecology

## 2013-02-05 MED ORDER — CONJ ESTROG-MEDROXYPROGEST ACE 0.3-1.5 MG PO TABS: 1.0000 | ORAL_TABLET | Freq: Every day | ORAL | Status: DC

## 2013-02-05 NOTE — Telephone Encounter (Signed)
Patient requesting an RX for hot flashes. "My follow up MMG from last week was normal and I need help please?"  [facility]

## 2013-02-05 NOTE — Telephone Encounter (Signed)
Dr. Edward Jolly,  No chart for patient. Patient was previously on Prempro. Do you need to see patient for OV?

## 2013-02-05 NOTE — Telephone Encounter (Signed)
I also received a message from the patient through My Chart and I refilled the PremPro for her.

## 2013-02-05 NOTE — Telephone Encounter (Signed)
Spoke with patient and advised that medication was placed for her. Patient agreeable and thankful.

## 2013-02-19 ENCOUNTER — Telehealth: Payer: Self-pay | Admitting: Orthopedic Surgery

## 2013-02-19 ENCOUNTER — Other Ambulatory Visit: Payer: Self-pay | Admitting: Obstetrics and Gynecology

## 2013-02-19 MED ORDER — CONJ ESTROG-MEDROXYPROGEST ACE 0.45-1.5 MG PO TABS: 1.0000 | ORAL_TABLET | Freq: Every day | ORAL | Status: DC

## 2013-02-19 NOTE — Telephone Encounter (Signed)
Spoke with pt about continued hot flashes despite being on prempro for 2 weeks. Pt has been averaging 4 a day with sweats, and this morning has had 3 in 2.5 hours. Having mood swings, anxiety, and feeling "wiped out" with flashes. Pt wondering if her dosage needs to be increased a little. Please advise.

## 2013-02-19 NOTE — Telephone Encounter (Signed)
Pt is still having hotflashes and has been on the prempro for 2 weeks. Please call her on her cell at [phone].

## 2013-02-19 NOTE — Telephone Encounter (Signed)
Please let patient know that I increased her Prempro and sent the new Rx to the pharmacy - 0.45/1.5 mg.  Please have her make an appointment to see me in 3 months for a recheck.

## 2013-02-19 NOTE — Telephone Encounter (Signed)
Spoke with pt about Prempro dose change being sent to pharmacy. Sched OV 05-24-13 at 1:45 for follow up with BS.

## 2013-03-01 ENCOUNTER — Other Ambulatory Visit: Payer: Self-pay

## 2013-03-26 ENCOUNTER — Encounter: Payer: Self-pay | Admitting: Obstetrics and Gynecology

## 2013-05-24 ENCOUNTER — Ambulatory Visit: Payer: BC Managed Care – PPO | Admitting: Obstetrics and Gynecology

## 2013-05-28 ENCOUNTER — Ambulatory Visit: Payer: BC Managed Care – PPO | Admitting: Obstetrics and Gynecology

## 2013-06-18 ENCOUNTER — Telehealth: Payer: Self-pay | Admitting: Obstetrics and Gynecology

## 2013-06-18 NOTE — Telephone Encounter (Signed)
Spoke with patient she states "I am having such a small amount of spotting that I almost didn't call!" States that yesterday, when having a BM noted "a tiny bit of brown blood and I mean a tiny bit" when wiping. To ensure that this was coming from the vagina she states that today she placed a tampon and when it was removed a few hours later states that "there was brown blood on the tampon but just the tiniest small amount." Had one episode of cramping this morning while driving to work work which she rates as a 1/10 and lasted 30 minutes and has resolved. Patient states she has been ill with the flu for the last five weeks and has been on Levaquin as well. She feels she has not been very consistent with her Prempro and stated "maybe I missed a few days, I wasn't really paying attention."  Otherwise patient states "I feel better than have in years!"  Advised would route message to Dr. Quincy Simmonds for instructions for follow up and return call. Patient is agreeable.

## 2013-06-18 NOTE — Telephone Encounter (Signed)
Patient states she has been having some discharge for the last 2 days and cramping.

## 2013-06-18 NOTE — Telephone Encounter (Signed)
If patient continues to bleed (red, pink, or brown) or bleeding recurs, I would like to have her return to the office. If she missed some dosages, this can cause the bleeding she had.

## 2013-06-19 NOTE — Telephone Encounter (Signed)
Message from Dr. Quincy Simmonds given. She is agreeable to plan and understands to call back with any continuation of symptoms. She has follow up appointment scheduled with Dr. Quincy Simmonds for 07/13/13.

## 2013-07-13 ENCOUNTER — Ambulatory Visit (INDEPENDENT_AMBULATORY_CARE_PROVIDER_SITE_OTHER): Payer: BC Managed Care – PPO | Admitting: Obstetrics and Gynecology

## 2013-07-13 ENCOUNTER — Encounter: Payer: Self-pay | Admitting: Obstetrics and Gynecology

## 2013-07-13 VITALS — BP 126/75 | HR 84 | Ht 63.75 in | Wt 189.0 lb

## 2013-07-13 DIAGNOSIS — E669 Obesity, unspecified: Secondary | ICD-10-CM

## 2013-07-13 DIAGNOSIS — N951 Menopausal and female climacteric states: Secondary | ICD-10-CM

## 2013-07-13 MED ORDER — CONJ ESTROG-MEDROXYPROGEST ACE 0.45-1.5 MG PO TABS: 1.0000 | ORAL_TABLET | Freq: Every day | ORAL | Status: DC

## 2013-07-13 NOTE — Progress Notes (Signed)
Patient ID: Daisy [name], female   DOB: 12-Oct-1955, 58 y.o.   MRN: [id number]  PCP - Dr. Inda Merlin  GYNECOLOGY  VISIT   HPI: 58 y.o.   Single  Caucasian  female   G0P0 with Patient's last menstrual period was 04/26/2010.   here for hormonal recheck.  Feeling really good.  On PremPro 0.45/1.5 mg. No hot flashes or night sweats. Off of HRT has 2 hot flashes an hour which are miserable.  Has cognitive changes when off HRT also.  No vaginal bleeding. Has some spotting when missed a couple of doses.   Patient is weaning off Ativan.  Takes only at night now. Was feeling too sleepy if takes it twice a day. Working with Dr. Inda Merlin on this.   Starting to do exercise.  She wants to loose 50 pounds. Has hypothyroidism. On Zoloft.  Wants help with this.  Wants to come to the office for weight checks to help her be accountable. Prefers not to do this through [employer].   GYNECOLOGIC HISTORY: Patient's last menstrual period was 04/26/2010. Contraception:    Menopausal hormone therapy:         OB History   Grav Para Term Preterm Abortions TAB SAB Ect Mult Living   0                  There are no active problems to display for this patient.   Past Medical History  Diagnosis Date  . Anxiety   . Depression   . OCD (obsessive compulsive disorder)   . Broken [name] 2013    right  . Thyroid disease 1994    hypothyroid--Dr. Darnelle Going  . Vitamin D deficiency   . Asthma     only when ill with bronchitis    Past Surgical History  Procedure Laterality Date  . Tonsillectomy and adenoidectomy      age 62  . Tonsillectomy      age 51yrs  . Laparoscopy Left 10/18/2012    Procedure: LEFT S&O;  Surgeon: Arloa Koh, MD;  Location: [facility];  Service: Gynecology;  Laterality: Left;  PELVIC WASHING  . Dilatation & currettage/hysteroscopy with resectocope N/A 10/18/2012    Procedure: DILATATION & CURETTAGE/HYSTEROSCOPY WITH RESECTOCOPE;  Surgeon: Arloa Koh, MD;  Location: [facility];  Service: Gynecology;  Laterality: N/A;  . Salpingoophorectomy Bilateral 10/18/2012    Procedure: SALPINGO OOPHORECTOMY;  Surgeon: Arloa Koh, MD;  Location: [facility];  Service: Gynecology;  Laterality: Bilateral;    Current Outpatient Prescriptions  Medication Sig Dispense Refill  . ibuprofen (ADVIL,MOTRIN) 800 MG tablet Take 1 tablet (800 mg total) by mouth every 8 (eight) hours as needed for pain.  30 tablet  0  . levothyroxine (SYNTHROID, LEVOTHROID) 125 MCG tablet Take 125 mcg by mouth daily before breakfast.      . LORazepam (ATIVAN) 1 MG tablet Take 1 tablet by mouth at bedtime.       . Multiple Vitamins-Minerals (MULTIVITAMIN PO) Take 1 tablet by mouth daily.      Marland Kitchen OVER THE COUNTER MEDICATION Take 1 capsule by mouth daily. Shaklee antioxidants/flavanoids with grape seed extract and green tea      . sertraline (ZOLOFT) 100 MG tablet Take 1.5 tablets by mouth daily.      . vitamin C (ASCORBIC ACID) 500 MG tablet Take 500 mg by mouth daily.      . Albuterol (VENTOLIN IN) Inhale 2 puffs into the lungs every 4 (four) hours as  needed. Wheezing/coughing during respiratory illness      . Cholecalciferol (VITAMIN D3) 2000 UNITS capsule Take 2,000 Units by mouth daily.       Marland Kitchen estrogen, conjugated,-medroxyprogesterone (PREMPRO) 0.45-1.5 MG per tablet Take 1 tablet by mouth daily.  30 tablet  5  . fexofenadine (ALLEGRA) 180 MG tablet Take 180 mg by mouth daily as needed. During allergy flare      . fluticasone (VERAMYST) 27.5 MCG/SPRAY nasal spray Place 2 sprays into the nose daily as needed for rhinitis. During allergy flare      . Fluticasone-Salmeterol (ADVAIR) 250-50 MCG/DOSE AEPB Inhale 1 puff into the lungs daily as needed. During respiratory illness or allergy flare       No current facility-administered medications for this visit.     ALLERGIES: Penicillins; Sudafed; and Thimerosal  Family History  Problem Relation Age of Onset  . Osteoarthritis Mother   . Thyroid disease  Mother   . Lung cancer Father     mets to liver/dec'd age 55  . Hypertension Father   . Thyroid disease Maternal Grandmother     History   Social History  . Marital Status: Single    Spouse Name: N/A    Number of Children: N/A  . Years of Education: N/A   Occupational History  . Not on file.   Social History Main Topics  . Smoking status: Never Smoker   . Smokeless tobacco: Not on file  . Alcohol Use: No  . Drug Use: No  . Sexual Activity: No     Comment: pt. is a virgin   Other Topics Concern  . Not on file   Social History Narrative  . No narrative on file    ROS:  Pertinent items are noted in HPI.  PHYSICAL EXAMINATION:    BP 126/75  Pulse 84  Ht 5' 3.75" (1.619 m)  Wt 189 lb (85.73 kg)  BMI 32.71 kg/m2  LMP 04/26/2010     General appearance: alert, cooperative and appears stated age  ASSESSMENT  Menopausal symptoms controlled on HRT, PremPro 0.45/1.5 mg daily. Obesity.  Motivation for weight loss.   PLAN  Reviewed risks and benefits of HRT - thromboembolic events, breast cancer vs. Symptomatic relief and improved quality of life, decreased risk of osteoporosis.  Patient educated that HRT is not to be used for dementia risk reduction.  Refills on PremPro for 4 months through Epic. Counseled on weight loss. Will see Nutrition counseling to assist with weight loss. Return in one month for a weight check.  Annual examination in June 2015.    An After Visit Summary was printed and given to the patient.  __20____ minutes face to face time of which over 50% was spent in counseling.

## 2013-08-20 ENCOUNTER — Telehealth: Payer: Self-pay | Admitting: Obstetrics and Gynecology

## 2013-08-20 NOTE — Telephone Encounter (Signed)
Patient pressed reschedule at her automated reminder call. I called her and left her a message to please give [place] a call back to reschedule her appointment for a "weight check."

## 2013-08-22 ENCOUNTER — Ambulatory Visit: Payer: BC Managed Care – PPO

## 2013-10-12 ENCOUNTER — Ambulatory Visit: Payer: BC Managed Care – PPO | Admitting: Obstetrics and Gynecology

## 2013-10-12 ENCOUNTER — Encounter: Payer: Self-pay | Admitting: Obstetrics and Gynecology

## 2014-02-05 ENCOUNTER — Ambulatory Visit
Admission: RE | Admit: 2014-02-05 | Discharge: 2014-02-05 | Disposition: A | Discharge status: Home / Self Care | Payer: BC Managed Care – PPO | Source: Ambulatory Visit | Attending: Family Medicine | Admitting: Family Medicine

## 2014-02-05 ENCOUNTER — Other Ambulatory Visit: Payer: Self-pay | Admitting: Family Medicine

## 2014-02-05 DIAGNOSIS — R05 Cough: Secondary | ICD-10-CM

## 2014-02-05 DIAGNOSIS — R059 Cough, unspecified: Secondary | ICD-10-CM

## 2014-04-01 ENCOUNTER — Other Ambulatory Visit (HOSPITAL_COMMUNITY)
Admission: RE | Admit: 2014-04-01 | Discharge: 2014-04-01 | Disposition: A | Discharge status: Home / Self Care | Payer: BC Managed Care – PPO | Source: Ambulatory Visit | Attending: Family Medicine | Admitting: Family Medicine

## 2014-04-01 ENCOUNTER — Other Ambulatory Visit: Payer: Self-pay | Admitting: Family Medicine

## 2014-04-01 DIAGNOSIS — Z124 Encounter for screening for malignant neoplasm of cervix: Secondary | ICD-10-CM | POA: Insufficient documentation

## 2014-04-02 LAB — CYTOLOGY - PAP

## 2014-06-10 ENCOUNTER — Encounter: Payer: Self-pay | Admitting: Obstetrics and Gynecology

## 2014-06-14 ENCOUNTER — Encounter: Payer: Self-pay | Admitting: Obstetrics and Gynecology

## 2014-06-14 ENCOUNTER — Other Ambulatory Visit: Payer: Self-pay

## 2014-06-14 ENCOUNTER — Ambulatory Visit (INDEPENDENT_AMBULATORY_CARE_PROVIDER_SITE_OTHER): Payer: BC Managed Care – PPO | Admitting: Obstetrics and Gynecology

## 2014-06-14 VITALS — BP 108/70 | HR 80 | Resp 18 | Ht 63.75 in | Wt 187.0 lb

## 2014-06-14 DIAGNOSIS — Z1231 Encounter for screening mammogram for malignant neoplasm of breast: Secondary | ICD-10-CM

## 2014-06-14 DIAGNOSIS — Z79890 Hormone replacement therapy (postmenopausal): Secondary | ICD-10-CM

## 2014-06-14 DIAGNOSIS — N951 Menopausal and female climacteric states: Secondary | ICD-10-CM

## 2014-06-14 NOTE — Patient Instructions (Signed)
[facility]

## 2014-06-14 NOTE — Progress Notes (Signed)
GYNECOLOGY  VISIT   HPI: 59 y.o.   Single  Caucasian  female   G0P0 with Patient's last menstrual period was 04/26/2010.   here for HRT management. Has been on HRT for 11 years and Dr. Inda Merlin suggested patient comes off. Had pap and breast exam with Dr. Inda Merlin in December.   Has been taking less and less often when weaning off over the last 5 weeks.  Having not flashes but they are less strong than in the past.  Had 14 hot flashes a day and up to 3 per hour.  Had increase symptoms of hand joint stiffness and pain.  Has restarted and getting some relief again.   No family history of cancer in the family.  Maternal GM and aunt with osteoporosis and dementia.   Broke foot a couple of years ago and had normal bone density.   Mammogram overdue.   States she mourned the loss of her ovaries when she had her surgery.   GYNECOLOGIC HISTORY: Patient's last menstrual period was 04/26/2010. Contraception: Post-Menopausal   Menopausal hormone therapy: Prempro 0.30 mg daily         OB History    Gravida Para Term Preterm AB TAB SAB Ectopic Multiple Living   0                  There are no active problems to display for this patient.   Past Medical History  Diagnosis Date  . Anxiety   . Depression   . OCD (obsessive compulsive disorder)   . Broken foot 2013    right  . Thyroid disease 1994    hypothyroid--Dr. Darnelle Going  . Vitamin D deficiency   . Asthma     only when ill with bronchitis    Past Surgical History  Procedure Laterality Date  . Tonsillectomy and adenoidectomy      age 49  . Tonsillectomy      age 82yrs  . Laparoscopy Left 10/18/2012    Procedure: LEFT S&O;  Surgeon: Arloa Koh, MD;  Location: [facility];  Service: Gynecology;  Laterality: Left;  PELVIC WASHING  . Dilatation & currettage/hysteroscopy with resectocope N/A 10/18/2012    Procedure: DILATATION & CURETTAGE/HYSTEROSCOPY WITH RESECTOCOPE;  Surgeon: Arloa Koh, MD;  Location: [facility];  Service:  Gynecology;  Laterality: N/A;  . Salpingoophorectomy Bilateral 10/18/2012    Procedure: SALPINGO OOPHORECTOMY;  Surgeon: Arloa Koh, MD;  Location: [facility];  Service: Gynecology;  Laterality: Bilateral;    Current Outpatient Prescriptions  Medication Sig Dispense Refill  . fexofenadine (ALLEGRA) 180 MG tablet Take 180 mg by mouth daily as needed. During allergy flare    . fluticasone (FLONASE) 50 MCG/ACT nasal spray as needed.  1  . fluticasone (VERAMYST) 27.5 MCG/SPRAY nasal spray Place 2 sprays into the nose daily as needed for rhinitis. During allergy flare    . Fluticasone-Salmeterol (ADVAIR) 250-50 MCG/DOSE AEPB Inhale 1 puff into the lungs daily as needed. During respiratory illness or allergy flare    . ibuprofen (ADVIL,MOTRIN) 800 MG tablet Take 1 tablet (800 mg total) by mouth every 8 (eight) hours as needed for pain. 30 tablet 0  . levothyroxine (SYNTHROID, LEVOTHROID) 125 MCG tablet Take 125 mcg by mouth daily before breakfast.    . LORazepam (ATIVAN) 1 MG tablet Take 1 tablet by mouth daily as needed.     . Multiple Vitamins-Minerals (MULTIVITAMIN PO) Take 1 tablet by mouth daily.    Marland Kitchen OVER THE COUNTER MEDICATION  Take 1 capsule by mouth daily. Shaklee antioxidants/flavanoids with grape seed extract and green tea    . PREMPRO 0.3-1.5 MG per tablet Take 1 tablet by mouth daily.  0  . sertraline (ZOLOFT) 100 MG tablet Take 100 mg by mouth daily.     . VENTOLIN HFA 108 (90 BASE) MCG/ACT inhaler as needed.  0  . vitamin C (ASCORBIC ACID) 500 MG tablet Take 500 mg by mouth daily.     No current facility-administered medications for this visit.     ALLERGIES: Penicillins; Sudafed; and Thimerosal  Family History  Problem Relation Age of Onset  . Osteoarthritis Mother   . Thyroid disease Mother   . Lung cancer Father     mets to liver/dec'd age 75  . Hypertension Father   . Thyroid disease Maternal Grandmother     History   Social History  . Marital Status: Single     Spouse Name: N/A  . Number of Children: N/A  . Years of Education: N/A   Occupational History  . Not on file.   Social History Main Topics  . Smoking status: Never Smoker   . Smokeless tobacco: Not on file  . Alcohol Use: No  . Drug Use: No  . Sexual Activity: No     Comment: pt. is a virgin   Other Topics Concern  . Not on file   Social History Narrative  . No narrative on file    ROS:  Pertinent items are noted in HPI.  PHYSICAL EXAMINATION:    BP 108/70 mmHg  Pulse 80  Resp 18  Ht 5' 3.75" (1.619 m)  Wt 187 lb (84.823 kg)  BMI 32.36 kg/m2  LMP 04/26/2010     General appearance: alert, cooperative and appears stated age   ASSESSMENT  Menopausal symptoms.  Intolerant of weaning off HRT.  PLAN  Get mammogram up to date.  If mammogram normal, will then call for refill on estrogen.  Discussed potential risks of HRT - DVT, PE, MI, stroke, breast cancer. HRT is not protective against dementia and may increase this risk.  HRT is protective against osteoporosis.  Discussed transdermal versus oral estrogen in combination with progesterone, such as Prometrium.   Patient wishes to continue PremPro 0.3/1.5 mg daily.    An After Visit Summary was printed and given to the patient.  __15____ minutes face to face time of which over 50% was spent in counseling.

## 2014-06-25 ENCOUNTER — Ambulatory Visit
Admission: RE | Admit: 2014-06-25 | Discharge: 2014-06-25 | Disposition: A | Discharge status: Home / Self Care | Payer: BC Managed Care – PPO | Source: Ambulatory Visit

## 2014-06-25 DIAGNOSIS — Z1231 Encounter for screening mammogram for malignant neoplasm of breast: Secondary | ICD-10-CM

## 2014-07-09 ENCOUNTER — Encounter: Payer: Self-pay | Admitting: Obstetrics and Gynecology

## 2014-07-09 MED ORDER — CONJ ESTROG-MEDROXYPROGEST ACE 0.3-1.5 MG PO TABS: 1.0000 | ORAL_TABLET | Freq: Every day | ORAL | Status: DC

## 2014-07-09 NOTE — Telephone Encounter (Signed)
Spoke with patient and message from Dr. Quincy Simmonds given.   Patient has medication follow up appointment scheduled for 10/16/14 with Dr. Quincy Simmonds. . She is unsure about scheduling an annual exam at this time due to insurance coverage as just recently had exam at primary care. She is willing to do what is required by Dr. Quincy Simmonds. She has scheduled an annual exam with Dr. Quincy Simmonds for 05/09/15 as well.

## 2014-07-09 NOTE — Telephone Encounter (Signed)
OK for PremPro refills for 4 months.         I really need to see the patient in June and examine her.     We just talked at her last appointment.         Thank you,         Josefa Half    ----- Message -----     From: [name]     Sent: 07/09/2014  8:54 AM      To: [facility] Clinical Pool    Subject: Non-Urgent Medical Question                 My mammogram was normal. Please renew my Prempro prescription. Daisy [name]

## 2014-07-09 NOTE — Telephone Encounter (Signed)
Dr. Quincy Simmonds, patient had normal 3D Mammogram on 06/25/14. Okay to place order for PremPro 0.3/1.5 mg daily. Okay for year supply? Last pap was 03/2014 with Burman Blacksmith.

## 2014-10-16 ENCOUNTER — Ambulatory Visit (INDEPENDENT_AMBULATORY_CARE_PROVIDER_SITE_OTHER): Payer: BC Managed Care – PPO | Admitting: Obstetrics and Gynecology

## 2014-10-16 ENCOUNTER — Encounter: Payer: Self-pay | Admitting: Obstetrics and Gynecology

## 2014-10-16 VITALS — BP 110/70 | HR 80 | Ht 63.75 in | Wt 191.0 lb

## 2014-10-16 DIAGNOSIS — N951 Menopausal and female climacteric states: Secondary | ICD-10-CM | POA: Diagnosis not present

## 2014-10-16 DIAGNOSIS — Z658 Other specified problems related to psychosocial circumstances: Secondary | ICD-10-CM

## 2014-10-16 DIAGNOSIS — F439 Reaction to severe stress, unspecified: Secondary | ICD-10-CM

## 2014-10-16 MED ORDER — CONJ ESTROG-MEDROXYPROGEST ACE 0.3-1.5 MG PO TABS: 1.0000 | ORAL_TABLET | Freq: Every day | ORAL | Status: DC

## 2014-10-16 NOTE — Progress Notes (Signed)
Patient ID: Daisy [name], female   DOB: 08-27-1955, 59 y.o.   MRN: [id number] GYNECOLOGY  VISIT   HPI: 59 y.o.   Single  Caucasian  female   G0P0 with Patient's last menstrual period was 04/26/2010.   here for 3 month follow up on HRT.  PremPro 0.3/1.5 mg daily. Hot flashes controlled well.  Night sweats are controlled.  Asking if it is time to wean off her HRT.  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 Is now redefining her career and her interests. Wants to lose weight.  Feels loss but also happy to have less administrative stress. Will focus on her research and writing.   Recent bronchitis.   Had pap and breast exam with Dr. Inda Merlin in December.   GYNECOLOGIC HISTORY: Patient's last menstrual period was 04/26/2010. Contraception: postmenopausal Menopausal hormone therapy:  Prempro 0.3-1.5mg  Last mammogram:  06-25-14 [facility]. Last pap smear: 04-01-14 wnl:no HPV testing done        OB History    Gravida Para Term Preterm AB TAB SAB Ectopic Multiple Living   0                  There are no active problems to display for this patient.   Past Medical History  Diagnosis Date  . Anxiety   . Depression   . OCD (obsessive compulsive disorder)   . Broken [name] 2013    right  . Thyroid disease 1994    hypothyroid--Dr. Darnelle Going  . Vitamin D deficiency   . Asthma     only when ill with bronchitis    Past Surgical History  Procedure Laterality Date  . Tonsillectomy and adenoidectomy      age 27  . Tonsillectomy      age 89yrs  . Laparoscopy Left 10/18/2012    Procedure: LEFT S&O;  Surgeon: Arloa Koh, MD;  Location: [facility];  Service: Gynecology;  Laterality: Left;  PELVIC WASHING  . Dilatation & currettage/hysteroscopy with resectocope N/A 10/18/2012    Procedure: DILATATION & CURETTAGE/HYSTEROSCOPY WITH RESECTOCOPE;  Surgeon: Arloa Koh, MD;  Location: [facility];  Service: Gynecology;  Laterality: N/A;  .  Salpingoophorectomy Bilateral 10/18/2012    Procedure: SALPINGO OOPHORECTOMY;  Surgeon: Arloa Koh, MD;  Location: [facility];  Service: Gynecology;  Laterality: Bilateral;    Current Outpatient Prescriptions  Medication Sig Dispense Refill  . diclofenac (CATAFLAM) 50 MG tablet Take 1 tablet by mouth as needed.  0  . estrogen, conjugated,-medroxyprogesterone (PREMPRO) 0.3-1.5 MG per tablet Take 1 tablet by mouth daily. 90 tablet 1  . fexofenadine (ALLEGRA) 180 MG tablet Take 180 mg by mouth daily as needed. During allergy flare    . fluticasone (FLONASE) 50 MCG/ACT nasal spray as needed.  1  . fluticasone (VERAMYST) 27.5 MCG/SPRAY nasal spray Place 2 sprays into the nose daily as needed for rhinitis. During allergy flare    . Fluticasone-Salmeterol (ADVAIR) 250-50 MCG/DOSE AEPB Inhale 1 puff into the lungs daily as needed. During respiratory illness or allergy flare    . ibuprofen (ADVIL,MOTRIN) 800 MG tablet Take 1 tablet (800 mg total) by mouth every 8 (eight) hours as needed for pain. 30 tablet 0  . levothyroxine (SYNTHROID, LEVOTHROID) 125 MCG tablet Take 125 mcg by mouth daily before breakfast.    . LORazepam (ATIVAN) 1 MG tablet Take 1 tablet by mouth daily as needed.     . Multiple Vitamins-Minerals (MULTIVITAMIN PO) Take 1  tablet by mouth daily.    . sertraline (ZOLOFT) 100 MG tablet Take 100 mg by mouth daily.     . VENTOLIN HFA 108 (90 BASE) MCG/ACT inhaler as needed.  0  . vitamin C (ASCORBIC ACID) 500 MG tablet Take 500 mg by mouth daily.     No current facility-administered medications for this visit.     ALLERGIES: Penicillins; Sudafed; and Thimerosal  Family History  Problem Relation Age of Onset  . Osteoarthritis Mother   . Thyroid disease Mother   . Lung cancer Father     mets to liver/dec'd age 58  . Hypertension Father   . Thyroid disease Maternal Grandmother     History   Social History  . Marital Status: Single    Spouse Name: N/A  . Number of Children: N/A   . Years of Education: N/A   Occupational History  . Not on file.   Social History Main Topics  . Smoking status: Never Smoker   . Smokeless tobacco: Not on file  . Alcohol Use: No  . Drug Use: No  . Sexual Activity: No     Comment: pt. is a virgin   Other Topics Concern  . Not on file   Social History Narrative    ROS:  Pertinent items are noted in HPI.  PHYSICAL EXAMINATION:    BP 110/70 mmHg  Pulse 80  Ht 5' 3.75" (1.619 m)  Wt 191 lb (86.637 kg)  BMI 33.05 kg/m2  LMP 04/26/2010    General appearance: alert, cooperative and appears stated age.  Bright smile but also tearful at times during the conversation. Head: Normocephalic, without obvious abnormality, atraumatic Lungs: clear to auscultation bilaterally Heart: regular rate and rhythm Extremities: extremities normal, atraumatic, no cyanosis or edema Neurologic: Grossly normal.  ASSESSMENT  HRT patient.  Doing well.  Situational stress and bereavement.   PLAN  Counseled regarding HRT risks - CV events and breast cancer.  I believe that the HRT is adding to the quality of the patient's life and she would like to continue for now.  Refill of Prempro 3 months with 2 refills. See orders. Return in January 2017 for full annual exam and reassessment at that time.  Support given.  Encouraged overall good health and weight loss.   An After Visit Summary was printed and given to the patient.  __25____ minutes face to face time of which over 50% was spent in counseling.

## 2015-05-09 ENCOUNTER — Ambulatory Visit: Payer: BC Managed Care – PPO | Admitting: Obstetrics and Gynecology

## 2015-06-06 ENCOUNTER — Encounter: Payer: Self-pay | Admitting: Obstetrics and Gynecology

## 2015-06-06 ENCOUNTER — Ambulatory Visit (INDEPENDENT_AMBULATORY_CARE_PROVIDER_SITE_OTHER): Payer: BC Managed Care – PPO | Admitting: Obstetrics and Gynecology

## 2015-06-06 VITALS — BP 130/84 | HR 84 | Resp 16 | Ht 63.75 in | Wt 195.4 lb

## 2015-06-06 DIAGNOSIS — Z01419 Encounter for gynecological examination (general) (routine) without abnormal findings: Secondary | ICD-10-CM

## 2015-06-06 DIAGNOSIS — Z79890 Hormone replacement therapy (postmenopausal): Secondary | ICD-10-CM | POA: Diagnosis not present

## 2015-06-06 MED ORDER — CONJ ESTROG-MEDROXYPROGEST ACE 0.3-1.5 MG PO TABS: 1.0000 | ORAL_TABLET | Freq: Every day | ORAL | Status: DC

## 2015-06-06 NOTE — Patient Instructions (Signed)

## 2015-06-06 NOTE — Progress Notes (Signed)
Patient ID: Daisy [name], female   DOB: 15-Mar-1956, 60 y.o.   MRN: [id number] 60 y.o. G0P0 Single Caucasian female here for annual exam.    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  Is not under as much stress but is experiencing loss due to this change. Now on Zoloft 50 mg and Synthroid 112 mcg daily.  Feeling better.  Gained 10 pounds.  Still seeing her therapist - Dr. Redmond Pulling for OCD.   On Prempro.  Wants to continue.   Hx laparoscopic BSO.   PCP: Darcus Austin, MD    Patient's last menstrual period was 04/26/2010.          Sexually active: No.  The current method of family planning is abstinence.    Exercising: Yes.    recently started walking. Smoker:  no  Health Maintenance: Pap:  04-01-14 Neg History of abnormal Pap:  no MMG:  06-25-14 3D/Density Cat.C/Neg:[facility].  Patient will schedule. Colonoscopy:  NEVER--PCP ref.her to GI. BMD:   2-3 yrs ago  Result  Normal with orthopedic TDaP:  2016 Screening Labs:  Hb today: PCP    reports that she has never smoked. She does not have any smokeless tobacco history on file. She reports that she does not drink alcohol or use illicit drugs.  Past Medical History  Diagnosis Date  . Anxiety   . Depression   . OCD (obsessive compulsive disorder)   . Broken [name] 2013    right  . Thyroid disease 1994    hypothyroid--Dr. Darnelle Going  . Vitamin D deficiency   . Asthma     only when ill with bronchitis    Past Surgical History  Procedure Laterality Date  . Tonsillectomy and adenoidectomy      age 60  . Tonsillectomy      age 60  . Laparoscopy Left 10/18/2012    Procedure: LEFT S&O;  Surgeon: Arloa Koh, MD;  Location: [facility];  Service: Gynecology;  Laterality: Left;  PELVIC WASHING  . Dilatation & currettage/hysteroscopy with resectocope N/A 10/18/2012    Procedure: DILATATION & CURETTAGE/HYSTEROSCOPY WITH RESECTOCOPE;  Surgeon: Arloa Koh, MD;  Location: [facility];  Service: Gynecology;   Laterality: N/A;  . Salpingoophorectomy Bilateral 10/18/2012    Procedure: SALPINGO OOPHORECTOMY;  Surgeon: Arloa Koh, MD;  Location: [facility];  Service: Gynecology;  Laterality: Bilateral;    Current Outpatient Prescriptions  Medication Sig Dispense Refill  . Cholecalciferol (VITAMIN D3) 2000 units TABS Take 1 tablet by mouth 2 (two) times daily.    Marland Kitchen estrogen, conjugated,-medroxyprogesterone (PREMPRO) 0.3-1.5 MG per tablet Take 1 tablet by mouth daily. 90 tablet 2  . fluticasone (FLONASE) 50 MCG/ACT nasal spray as needed.  1  . Fluticasone-Salmeterol (ADVAIR) 250-50 MCG/DOSE AEPB Inhale 1 puff into the lungs daily as needed. During respiratory illness or allergy flare    . ibuprofen (ADVIL,MOTRIN) 800 MG tablet Take 1 tablet (800 mg total) by mouth every 8 (eight) hours as needed for pain. 30 tablet 0  . LORazepam (ATIVAN) 1 MG tablet Take 1 tablet by mouth daily as needed.     . Multiple Vitamins-Minerals (MULTIVITAMIN PO) Take 1 tablet by mouth daily.    . sertraline (ZOLOFT) 50 MG tablet Take 1 tablet by mouth daily.  0  . SYNTHROID 112 MCG tablet Take 1 tablet by mouth daily.  1  . VENTOLIN HFA 108 (90 BASE) MCG/ACT inhaler as needed.  0  . vitamin C (ASCORBIC ACID) 500  MG tablet Take 500 mg by mouth daily.     No current facility-administered medications for this visit.    Family History  Problem Relation Age of Onset  . Osteoarthritis Mother   . Thyroid disease Mother   . Lung cancer Father     mets to liver/dec'd age 21  . Hypertension Father   . Thyroid disease Maternal Grandmother     ROS:  Pertinent items are noted in HPI.  Otherwise, a comprehensive ROS was negative.  Exam:   BP 130/84 mmHg  Pulse 84  Resp 16  Ht 5' 3.75" (1.619 m)  Wt 195 lb 6.4 oz (88.633 kg)  BMI 33.81 kg/m2  LMP 04/26/2010    General appearance: alert, cooperative and appears stated age Head: Normocephalic, without obvious abnormality, atraumatic Neck: no adenopathy, supple, symmetrical,  trachea midline and thyroid normal to inspection and palpation Lungs: clear to auscultation bilaterally Breasts: normal appearance, no masses or tenderness, Inspection negative, No nipple retraction or dimpling, No nipple discharge or bleeding, No axillary or supraclavicular adenopathy Heart: regular rate and rhythm Abdomen: soft, non-tender; bowel sounds normal; no masses,  no organomegaly Extremities: extremities normal, atraumatic, no cyanosis or edema Skin: Skin color, texture, turgor normal. No rashes or lesions Lymph nodes: Cervical, supraclavicular, and axillary nodes normal. No abnormal inguinal nodes palpated Neurologic: Grossly normal  Pelvic: External genitalia:  no lesions              Urethra:  normal appearing urethra with no masses, tenderness or lesions              Bartholins and Skenes: normal                 Vagina: normal appearing vagina with normal color and discharge, no lesions              Cervix: no lesions              Pap taken: No. Bimanual Exam:  Uterus:  normal size, contour, position, consistency, mobility, non-tender              Adnexa: normal adnexa and no mass, fullness, tenderness              Rectovaginal: Yes.  .  Confirms.              Anus:  normal sphincter tone, no lesions  Chaperone was present for exam.  Assessment:   Well woman visit with normal exam. Status post lap BSO. HRT patient.  Hypothyroidism.   Plan: Yearly mammogram recommended after age 5.  Recommended self breast exam.  Pap and HR HPV as above. Discussed Calcium, Vitamin D, regular exercise program including cardiovascular and weight bearing exercise. Labs performed.  No..   See orders. Refills given on medications.  Yes.  .  See orders.  Prempro 0.3/1.5 mg Rx for one year.   I discussed risks of DVT, PE, MI, stroke, and breast cancer.  Patient wishes to continue but would like to consider weaning off in the fall.  She will make an appointment around September to discuss  her HRT. Follow up annually and prn.    After visit summary provided.

## 2015-08-27 ENCOUNTER — Encounter: Payer: Self-pay | Admitting: Obstetrics and Gynecology

## 2015-08-27 ENCOUNTER — Telehealth: Payer: Self-pay

## 2015-08-27 NOTE — Telephone Encounter (Signed)
Bleeding likely due to missed pill.  If bleeding or spotting recurs, needs office visit with sonohysterogram and possible EMB. HRT pills are the same no matter if taking horizontally or vertically.

## 2015-08-27 NOTE — Telephone Encounter (Signed)
Non-Urgent Medical Question  Message [id number]   From  [name]   To  Nunzio Cobbs, MD   Sent  08/27/2015 12:13 PM     Dr. Quincy Simmonds: Today I had a TINY bit of dark discharge (a drip) and a TINY bit of AM cramping. On a scale of 1-10, ten being three years ago when I had the polyps and periods, this is < 1. But thought you should know. I did miss one night of taking my hormone last week. If you need to see me, let me know and I will make an appointment. I feel fine. QX: Am I supposed to take my hormones horizontally from the card? (row of 7)Aren't they all the same? I've been taking them vertically (row of 4). Sigh, Daisy [name]    Pool - [facility] Clinical Pool No one has taken responsibility for this message.     No actions have been taken on this message.     Routing to [place] for review and advise. Patient is currently taking Prempro 0.3-1.5 mg daily.

## 2015-08-27 NOTE — Telephone Encounter (Signed)
Telephone encounter created to discuss mychart message with Dr.Silva. 

## 2015-08-27 NOTE — Telephone Encounter (Signed)
Spoke with patient. Advised of message as seen below from [place]. She is agreeable and verbalizes understanding.  Routing to provider for final review. Patient agreeable to disposition. Will close encounter.

## 2015-12-31 ENCOUNTER — Ambulatory Visit: Payer: BC Managed Care – PPO | Admitting: Obstetrics and Gynecology

## 2016-01-08 ENCOUNTER — Ambulatory Visit (INDEPENDENT_AMBULATORY_CARE_PROVIDER_SITE_OTHER): Payer: BC Managed Care – PPO | Admitting: Obstetrics and Gynecology

## 2016-01-08 VITALS — BP 120/80 | HR 76 | Resp 16 | Ht 63.75 in | Wt 194.6 lb

## 2016-01-08 DIAGNOSIS — Z79890 Hormone replacement therapy (postmenopausal): Secondary | ICD-10-CM

## 2016-01-08 MED ORDER — CONJ ESTROG-MEDROXYPROGEST ACE 0.3-1.5 MG PO TABS: ORAL_TABLET | ORAL | 0 refills | Status: DC

## 2016-01-08 NOTE — Progress Notes (Signed)
GYNECOLOGY  VISIT   HPI: 60 y.o.   Single  Caucasian  female   G0P0 with Patient's last menstrual period was 04/26/2010.   here for follow up of HRT medication.     On PremPro.  If skips a dose, can have spotting.  Skipped 3 times, and had the spotting.  No hot flashes or night sweats when she misses a dose.  Feels emotionally ok .  Had some disappointing issues at work.  On Zoloft 25 mg daily which seems to be a good dosage for her.   GYNECOLOGIC HISTORY: Patient's last menstrual period was 04/26/2010. Contraception: Surgical Menopausal hormone therapy:  Prempro Last mammogram: 06/25/2014 [facility] Last pap smear:   04/01/14 Neg        OB History    Gravida Para Term Preterm AB Living   0             SAB TAB Ectopic Multiple Live Births                     There are no active problems to display for this patient.   Past Medical History:  Diagnosis Date  . Anxiety   . Asthma    only when ill with bronchitis  . Broken foot 2013   right  . Depression   . OCD (obsessive compulsive disorder)   . Thyroid disease 1994   hypothyroid--Dr. Darnelle Going  . Vitamin D deficiency     Past Surgical History:  Procedure Laterality Date  . DILATATION & CURRETTAGE/HYSTEROSCOPY WITH RESECTOCOPE N/A 10/18/2012   Procedure: DILATATION & CURETTAGE/HYSTEROSCOPY WITH RESECTOCOPE;  Surgeon: Arloa Koh, MD;  Location: [facility];  Service: Gynecology;  Laterality: N/A;  . LAPAROSCOPY Left 10/18/2012   Procedure: LEFT S&O;  Surgeon: Arloa Koh, MD;  Location: [facility];  Service: Gynecology;  Laterality: Left;  PELVIC WASHING  . SALPINGOOPHORECTOMY Bilateral 10/18/2012   Procedure: SALPINGO OOPHORECTOMY;  Surgeon: Arloa Koh, MD;  Location: [facility];  Service: Gynecology;  Laterality: Bilateral;  . TONSILLECTOMY     age 13yrs  . TONSILLECTOMY AND ADENOIDECTOMY     age 70    Current Outpatient Prescriptions  Medication Sig Dispense Refill  . Cholecalciferol  (VITAMIN D3) 2000 units TABS Take 1 tablet by mouth 2 (two) times daily.    Marland Kitchen estrogen, conjugated,-medroxyprogesterone (PREMPRO) 0.3-1.5 MG tablet Take 1 tablet by mouth daily. 90 tablet 3  . fluticasone (FLONASE) 50 MCG/ACT nasal spray as needed.  1  . Fluticasone-Salmeterol (ADVAIR) 250-50 MCG/DOSE AEPB Inhale 1 puff into the lungs daily as needed. During respiratory illness or allergy flare    . ibuprofen (ADVIL,MOTRIN) 800 MG tablet Take 1 tablet (800 mg total) by mouth every 8 (eight) hours as needed for pain. 30 tablet 0  . LORazepam (ATIVAN) 1 MG tablet Take 1 tablet by mouth daily as needed.     . Multiple Vitamins-Minerals (MULTIVITAMIN PO) Take 1 tablet by mouth daily.    . sertraline (ZOLOFT) 25 MG tablet daily.    Marland Kitchen SYNTHROID 112 MCG tablet Take 1 tablet by mouth daily.  1  . VENTOLIN HFA 108 (90 BASE) MCG/ACT inhaler as needed.  0  . vitamin C (ASCORBIC ACID) 500 MG tablet Take 500 mg by mouth daily.     No current facility-administered medications for this visit.      ALLERGIES: Penicillins; Sudafed [pseudoephedrine hcl]; and Thimerosal  Family History  Problem Relation Age of Onset  .  Osteoarthritis Mother   . Thyroid disease Mother   . Lung cancer Father     mets to liver/dec'd age 29  . Hypertension Father   . Thyroid disease Maternal Grandmother     Social History   Social History  . Marital status: Single    Spouse name: N/A  . Number of children: N/A  . Years of education: N/A   Occupational History  . Not on file.   Social History Main Topics  . Smoking status: Never Smoker  . Smokeless tobacco: Not on file  . Alcohol use No  . Drug use: No  . Sexual activity: No     Comment: pt. is a virgin   Other Topics Concern  . Not on file   Social History Narrative  . No narrative on file    ROS:  Pertinent items are noted in HPI.  PHYSICAL EXAMINATION:    BP 120/80 (BP Location: Right Arm, Patient Position: Sitting, Cuff Size: Normal)   Pulse 76    Resp 16   Ht 5' 3.75" (1.619 m)   Wt 194 lb 9.6 oz (88.3 kg)   LMP 04/26/2010   BMI 33.67 kg/m     General appearance: alert, cooperative and appears stated age  ASSESSMENT  HRT patient.  Status post BSO.  PLAN  We discussed the WHI and risks and benefits of HRT.  We will continue to wean down further and patient will cut her Prempro in half for the next 3 months and then stop. #30, RF none.  Patient will call to day to schedule her mammogram. Follow up for annual exam and prn.    An After Visit Summary was printed and given to the patient.  _15_____ minutes face to face time of which over 50% was spent in counseling.

## 2016-01-11 ENCOUNTER — Encounter: Payer: Self-pay | Admitting: Obstetrics and Gynecology

## 2016-01-15 ENCOUNTER — Telehealth: Payer: Self-pay | Admitting: Obstetrics and Gynecology

## 2016-01-15 ENCOUNTER — Encounter: Payer: Self-pay | Admitting: Obstetrics and Gynecology

## 2016-01-15 ENCOUNTER — Other Ambulatory Visit: Payer: Self-pay | Admitting: Obstetrics and Gynecology

## 2016-01-15 DIAGNOSIS — Z1231 Encounter for screening mammogram for malignant neoplasm of breast: Secondary | ICD-10-CM

## 2016-01-15 MED ORDER — CONJ ESTROG-MEDROXYPROGEST ACE 0.3-1.5 MG PO TABS: 1.0000 | ORAL_TABLET | Freq: Every day | ORAL | 0 refills | Status: DC

## 2016-01-15 NOTE — Telephone Encounter (Signed)
Patient would like to speak to nurse about her medication prescription and how it was written.

## 2016-01-15 NOTE — Telephone Encounter (Signed)
Spoke with Research officer, political party at [employer]. States New  order for Prempro will satisfy patients needs. Will call patient to follow-up.

## 2016-01-15 NOTE — Telephone Encounter (Signed)
Spoke with patient. Advised per Dr. Quincy Simmonds note as seen below. Advised patient spoke with Research officer, political party at [employer]. Patient is agreeable. Call office with any additional questions/concerns.  Routing to provider for final review. Patient is agreeable to disposition. Will close encounter.

## 2016-01-15 NOTE — Telephone Encounter (Signed)
Spoke with patient. Patient states she was in for OV 01/08/16 and discussed Prempro prescription. Patient reviewed MyChart message as seen below. Advised would follow-up with Dr. Quincy Simmonds and return call for suggestions. Patient is agreeable.   Dr. Quincy Simmonds, please advise?      From [name] To Nunzio Cobbs, MD Sent 01/15/2016 9:20 AM  Dr. Quincy Simmonds:  I need for you to rewrite/rephrase my final prescription for Prempro.  [facility] charged me $60 for ONE box (at 1/2 dose) instead of $30.  I am supposed to get TWO BOXES, to taper down over the next 3-4 months at 1/2 dose.  I talked to the pharmacist and she says the Insurance co sees this 1/2 dose as 60 days,  and thus they charged me DOUBLE for one box.  As written, I cannot get the second box AND I would have to pay another $60.   I am putting a call into your nurse, too.  Please help.  Thanks,  Daisy [name]   and discussed weaning the Prempro.

## 2016-01-15 NOTE — Telephone Encounter (Signed)
Ok to write for Prempro 0.3/1.5 mg.   Sig: 1 po q day.  Disp:  90 day Rx. I am not sure if the patient will be able to really split the pill or tolerate the reduced dosage at this time.  She knows that she may try this.

## 2016-02-03 ENCOUNTER — Ambulatory Visit
Admission: RE | Admit: 2016-02-03 | Discharge: 2016-02-03 | Disposition: A | Discharge status: Home / Self Care | Payer: BC Managed Care – PPO | Source: Ambulatory Visit | Attending: Obstetrics and Gynecology | Admitting: Obstetrics and Gynecology

## 2016-02-03 DIAGNOSIS — Z1231 Encounter for screening mammogram for malignant neoplasm of breast: Secondary | ICD-10-CM

## 2016-02-10 ENCOUNTER — Ambulatory Visit
Admission: RE | Admit: 2016-02-10 | Discharge: 2016-02-10 | Disposition: A | Discharge status: Home / Self Care | Payer: BC Managed Care – PPO | Source: Ambulatory Visit | Attending: Family Medicine | Admitting: Family Medicine

## 2016-02-10 ENCOUNTER — Other Ambulatory Visit: Payer: Self-pay | Admitting: Family Medicine

## 2016-02-10 DIAGNOSIS — R52 Pain, unspecified: Secondary | ICD-10-CM

## 2016-03-14 ENCOUNTER — Encounter: Payer: Self-pay | Admitting: Obstetrics and Gynecology

## 2016-03-15 ENCOUNTER — Telehealth: Payer: Self-pay | Admitting: *Deleted

## 2016-03-15 NOTE — Telephone Encounter (Signed)
Dr. Sabra Heck -please see MyChart message below and advise?    From [name] To Nunzio Cobbs, MD Sent 03/14/2016 6:43 PM  Dear Dr. Quincy Simmonds:  I have been tapering off PremPro and have been at 1/2 a pill for about six weeks? (since our last office visit). Last week I sort of just dropped it and went about a week without. Starting Thursday, I have been having 3-4 hot flashes a night waking me up. Saturday I was at a church event in [place] and had 3 severe hot flashes in 75 minutes, leaving me nauseous & anxious. I had 3 more flashes last night. I went ahead and took a whole pill (.3 Prepro) yesterday when I got home and a whole one today. I also got my prescription refilled (90 day supply) today. I would like to go back to a whole pill for a month and then drop down to half pill for a few months.  I see you in January, I think. Want to discuss additional options.  I would like to have a saliva test done that tests my individual hormone levels. Can we do that?  I have a friend who does the bio-identical hormones. Is that something you do? Cream?  Black Cohosh? Can I take with Prepro?  Happy Thanksgiving!  Daisy [name]

## 2016-03-17 NOTE — Telephone Encounter (Signed)
Spoke with patient, advised as seen below per Dr. Sabra Heck. Patient thankful for return call. Patient verbalizes understanding and is agreeable. Patient will discuss at next AEX with Dr. Quincy Simmonds.  Routing to provider for final review. Patient is agreeable to disposition. Will close encounter.  Cc: Dr. Quincy Simmonds

## 2016-03-17 NOTE — Telephone Encounter (Signed)
It is fine for her to continue the full tablet she's been taking.  Please inform her salivary testing does not have good correlation with serum estrogen levels.  It is done in a lot of places but serum testing is more accurate.  She will need to discuss her other questions about hormone administration when she sees Dr. Quincy Simmonds.

## 2016-06-17 ENCOUNTER — Other Ambulatory Visit: Payer: Self-pay | Admitting: Obstetrics and Gynecology

## 2016-06-17 NOTE — Telephone Encounter (Signed)
Medication refill request: Prempro  Last AEX:  06-06-15  Next AEX: 06-30-16  Last MMG (if hormonal medication request): 02-03-16 WNL  Refill authorized: please advise

## 2016-06-28 NOTE — Progress Notes (Signed)
61 y.o. G0P0 Single Caucasian female here for annual exam.    On Prempro.  Cutting them in 1/2.  Wants to get off of the HRT. Has enough for about 2 months.   Had emergency laser surgery in both eyes around holiday time last year. Had bilateral retinal tears.   Met someone who was not truthful with her that he was married.  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  Publishing a new novel.  Wants to loose weight.   Has low vit D level which is now in the 30s.  Does all of her labs with her PCP.  Maternal aunt and grandmother had osteoporosis.   PCP:  Darcus Austin   Patient's last menstrual period was 04/26/2010.           Sexually active: No.  The current method of family planning is post menopausal status/BSO.    Exercising: Yes.    walking Smoker:  no  Health Maintenance: Pap:04-01-14 Neg;  History of abnormal Pap:  no MMG:  02-03-16 Density D/Neg/biRads1:[facility] Colonoscopy: ??NEVER.  She will do this this summer.  BMD:   2014  Result:normal with orthopedic TDaP:  2016 HIV: no Hep C: no Screening Labs:  Hb today: PCP, Urine today: not collected   reports that she has never smoked. She has never used smokeless tobacco. She reports that she does not drink alcohol or use drugs.  Past Medical History:  Diagnosis Date  . Anxiety   . Asthma    only when ill with bronchitis  . Broken foot 2013   right  . Depression   . OCD (obsessive compulsive disorder)   . Retinal tear, bilateral    03/2016   . Thyroid disease 1994   hypothyroid--Dr. Darnelle Going  . Vitamin D deficiency     Past Surgical History:  Procedure Laterality Date  . DILATATION & CURRETTAGE/HYSTEROSCOPY WITH RESECTOCOPE N/A 10/18/2012   Procedure: DILATATION & CURETTAGE/HYSTEROSCOPY WITH RESECTOCOPE;  Surgeon: Arloa Koh, MD;  Location: [facility];  Service: Gynecology;  Laterality: N/A;  . LAPAROSCOPY Left 10/18/2012   Procedure: LEFT S&O;  Surgeon: Arloa Koh, MD;   Location: [facility];  Service: Gynecology;  Laterality: Left;  PELVIC WASHING  . RETINAL TEAR REPAIR CRYOTHERAPY Bilateral    03/2016  . SALPINGOOPHORECTOMY Bilateral 10/18/2012   Procedure: SALPINGO OOPHORECTOMY;  Surgeon: Arloa Koh, MD;  Location: [facility];  Service: Gynecology;  Laterality: Bilateral;  . TONSILLECTOMY     age 73yr  . TONSILLECTOMY AND ADENOIDECTOMY     age 13    Current Outpatient Prescriptions  Medication Sig Dispense Refill  . Cholecalciferol (VITAMIN D3) 2000 units TABS Take 1 tablet by mouth 2 (two) times daily.    .Marland Kitchenestrogen, conjugated,-medroxyprogesterone (PREMPRO) 0.3-1.5 MG tablet Take 1 tablet by mouth daily. 30 tablet 0  . fluticasone (FLONASE) 50 MCG/ACT nasal spray as needed.  1  . Fluticasone-Salmeterol (ADVAIR) 250-50 MCG/DOSE AEPB Inhale 1 puff into the lungs daily as needed. During respiratory illness or allergy flare    . ibuprofen (ADVIL,MOTRIN) 800 MG tablet Take 1 tablet (800 mg total) by mouth every 8 (eight) hours as needed for pain. 30 tablet 0  . LORazepam (ATIVAN) 1 MG tablet Take 1-2 tablets by mouth daily as needed.     . Multiple Vitamins-Minerals (MULTIVITAMIN PO) Take 1 tablet by mouth daily.    . sertraline (ZOLOFT) 25 MG tablet daily.    .Marland KitchenSYNTHROID 112 MCG tablet  Take 1 tablet by mouth daily.  1  . VENTOLIN HFA 108 (90 BASE) MCG/ACT inhaler as needed.  0  . vitamin C (ASCORBIC ACID) 500 MG tablet Take 500 mg by mouth daily.    Marland Kitchen nystatin (MYCOSTATIN/NYSTOP) powder Apply topically 3 (three) times daily. Apply to affected area for up to 7 days 15 g 2   No current facility-administered medications for this visit.     Family History  Problem Relation Age of Onset  . Osteoarthritis Mother   . Thyroid disease Mother   . Lung cancer Father     mets to liver/dec'd age 65  . Hypertension Father   . Thyroid disease Maternal Grandmother     ROS:  Pertinent items are noted in HPI.  Otherwise, a comprehensive ROS was negative.  Exam:    BP 110/70   Pulse 72   Resp 16   Ht 5' 4" (1.626 m)   Wt 193 lb (87.5 kg)   LMP 04/26/2010   BMI 33.13 kg/m     General appearance: alert, cooperative and appears stated age Head: Normocephalic, without obvious abnormality, atraumatic Neck: no adenopathy, supple, symmetrical, trachea midline and thyroid normal to inspection and palpation Lungs: clear to auscultation bilaterally Breasts: normal appearance, no masses or tenderness, No nipple retraction or dimpling, No nipple discharge or bleeding, No axillary or supraclavicular adenopathy.  Erythema under right breast consistent with yeast.  Heart: regular rate and rhythm Abdomen: soft, non-tender; no masses, no organomegaly Extremities: extremities normal, atraumatic, no cyanosis or edema Skin: Skin color, texture, turgor normal. No rashes or lesions Lymph nodes: Cervical, supraclavicular, and axillary nodes normal. No abnormal inguinal nodes palpated Neurologic: Grossly normal  Pelvic: External genitalia:  no lesions              Urethra:  normal appearing urethra with no masses, tenderness or lesions              Bartholins and Skenes: normal                 Vagina: normal appearing vagina with normal color and discharge, no lesions              Cervix: no lesions              Pap taken: Yes.   Bimanual Exam:  Uterus:  normal size, contour, position, consistency, mobility, non-tender              Adnexa: no mass, fullness, tenderness              Rectal exam: Yes.  .  Confirms.              Anus:  normal sphincter tone, no lesions  Chaperone was present for exam.  Assessment:   Well woman visit with normal exam. Status post laparoscopic BSO.  HRT.  Weaning off.  Yeast in intertriginous area.  Plan: Mammogram screening discussed. Recommended self breast awareness. Pap and HR HPV as above. Guidelines for Calcium, Vitamin D, regular exercise program including cardiovascular and weight bearing exercise. Nystatin powder to  area tid prn.  Refill of Prempro for one more month.  She will wean off over the next 3 - 4 months.  She will start taking 1/2 tab daily.  Will get copy of labs from PCP. Follow up annually and prn.      After visit summary provided.

## 2016-06-30 ENCOUNTER — Encounter: Payer: Self-pay | Admitting: Obstetrics and Gynecology

## 2016-06-30 ENCOUNTER — Ambulatory Visit (INDEPENDENT_AMBULATORY_CARE_PROVIDER_SITE_OTHER): Payer: BC Managed Care – PPO | Admitting: Obstetrics and Gynecology

## 2016-06-30 VITALS — BP 110/70 | HR 72 | Resp 16 | Ht 64.0 in | Wt 193.0 lb

## 2016-06-30 DIAGNOSIS — Z01419 Encounter for gynecological examination (general) (routine) without abnormal findings: Secondary | ICD-10-CM

## 2016-06-30 MED ORDER — CONJ ESTROG-MEDROXYPROGEST ACE 0.3-1.5 MG PO TABS: 1.0000 | ORAL_TABLET | Freq: Every day | ORAL | 0 refills | Status: DC

## 2016-06-30 MED ORDER — NYSTATIN 100000 UNIT/GM EX POWD: Freq: Three times a day (TID) | CUTANEOUS | 2 refills | Status: DC

## 2016-06-30 NOTE — Patient Instructions (Signed)

## 2016-07-02 LAB — IPS PAP TEST WITH HPV

## 2016-11-02 ENCOUNTER — Ambulatory Visit: Payer: BC Managed Care – PPO

## 2017-05-16 ENCOUNTER — Encounter: Payer: Self-pay | Admitting: Obstetrics and Gynecology

## 2017-07-08 ENCOUNTER — Ambulatory Visit: Payer: BC Managed Care – PPO | Admitting: Obstetrics and Gynecology

## 2017-08-11 ENCOUNTER — Encounter: Payer: Self-pay | Admitting: Obstetrics and Gynecology

## 2017-09-16 ENCOUNTER — Other Ambulatory Visit: Payer: Self-pay | Admitting: Obstetrics and Gynecology

## 2017-09-16 ENCOUNTER — Ambulatory Visit: Payer: BC Managed Care – PPO | Admitting: Obstetrics and Gynecology

## 2017-09-16 ENCOUNTER — Other Ambulatory Visit: Payer: Self-pay

## 2017-09-16 ENCOUNTER — Encounter: Payer: Self-pay | Admitting: Obstetrics and Gynecology

## 2017-09-16 VITALS — BP 104/66 | HR 80 | Resp 14 | Ht 64.0 in | Wt 191.0 lb

## 2017-09-16 DIAGNOSIS — Z01419 Encounter for gynecological examination (general) (routine) without abnormal findings: Secondary | ICD-10-CM

## 2017-09-16 DIAGNOSIS — Z780 Asymptomatic menopausal state: Secondary | ICD-10-CM

## 2017-09-16 DIAGNOSIS — Z1231 Encounter for screening mammogram for malignant neoplasm of breast: Secondary | ICD-10-CM

## 2017-09-16 NOTE — Patient Instructions (Signed)

## 2017-09-16 NOTE — Progress Notes (Signed)
62 y.o. G0P0 Single Caucasian female here for annual exam.    On Doxepin for anxiety.  Hot flashes are improved.   Is off HRT.  Some insomnia.  Takes Lorazepam for this if needed.  Having some mobility issues.  Feels discomfort in her feet when she gets up after sitting still or lying.  Started walking now.  Working with bands for strengthening.   Wants a bone density.   PCP: Dr. Darcus Austin. Dr.  Buddy Duty - manages thyroid.     Patient's last menstrual period was 04/26/2010.           Sexually active: No.  The current method of family planning is post menopausal status and BSO.    Exercising: Yes.    walking, yoga Smoker:  no  Health Maintenance: Pap:  06/30/16 Pap and HR HPV negative History of abnormal Pap:  no MMG:  02/03/16 BIRADS 1 negative/density d Colonoscopy:  never BMD:   2014  Result  Normal done with Orthopedic TDaP:  2016 Gardasil:   n/a HIV: no Hep C: no Screening Labs:  PCP   reports that she has never smoked. She has never used smokeless tobacco. She reports that she does not drink alcohol or use drugs.  Past Medical History:  Diagnosis Date  . Anxiety   . Asthma    only when ill with bronchitis  . Broken foot 2013   right  . Depression   . OCD (obsessive compulsive disorder)   . Retinal tear, bilateral    03/2016   . Thyroid disease 1994   hypothyroid--Dr. Darnelle Going  . Vitamin D deficiency     Past Surgical History:  Procedure Laterality Date  . DILATATION & CURRETTAGE/HYSTEROSCOPY WITH RESECTOCOPE N/A 10/18/2012   Procedure: DILATATION & CURETTAGE/HYSTEROSCOPY WITH RESECTOCOPE;  Surgeon: Arloa Koh, MD;  Location: [facility];  Service: Gynecology;  Laterality: N/A;  . LAPAROSCOPY Left 10/18/2012   Procedure: LEFT S&O;  Surgeon: Arloa Koh, MD;  Location: [facility];  Service: Gynecology;  Laterality: Left;  PELVIC WASHING  . RETINAL TEAR REPAIR CRYOTHERAPY Bilateral    03/2016  . SALPINGOOPHORECTOMY Bilateral 10/18/2012    Procedure: SALPINGO OOPHORECTOMY;  Surgeon: Arloa Koh, MD;  Location: [facility];  Service: Gynecology;  Laterality: Bilateral;  . TONSILLECTOMY     age 89yrs  . TONSILLECTOMY AND ADENOIDECTOMY     age 15    Current Outpatient Medications  Medication Sig Dispense Refill  . Cholecalciferol (VITAMIN D3) 2000 units TABS Take 1 tablet by mouth 2 (two) times daily.    Marland Kitchen doxepin (SINEQUAN) 50 MG capsule Take 1 capsule by mouth daily.  1  . fluticasone (FLONASE) 50 MCG/ACT nasal spray as needed.  1  . Fluticasone-Salmeterol (ADVAIR) 250-50 MCG/DOSE AEPB Inhale 1 puff into the lungs daily as needed. During respiratory illness or allergy flare    . ibuprofen (ADVIL,MOTRIN) 800 MG tablet Take 1 tablet (800 mg total) by mouth every 8 (eight) hours as needed for pain. 30 tablet 0  . LORazepam (ATIVAN) 1 MG tablet Take 1-2 tablets by mouth daily as needed.     . Multiple Vitamins-Minerals (MULTIVITAMIN PO) Take 1 tablet by mouth daily.    Marland Kitchen SYNTHROID 100 MCG tablet Take 100 mcg by mouth daily.   1  . VENTOLIN HFA 108 (90 BASE) MCG/ACT inhaler as needed.  0  . vitamin C (ASCORBIC ACID) 500 MG tablet Take 500 mg by mouth daily.     No current facility-administered medications  for this visit.     Family History  Problem Relation Age of Onset  . Osteoarthritis Mother   . Thyroid disease Mother   . Lung cancer Father        mets to liver/dec'd age 8  . Hypertension Father   . Thyroid disease Maternal Grandmother     Review of Systems  Constitutional: Negative.   HENT: Negative.   Eyes: Negative.   Respiratory: Negative.   Cardiovascular: Negative.   Gastrointestinal: Negative.   Endocrine: Negative.   Genitourinary: Negative.   Musculoskeletal:       Muscle or joint pain  Skin: Negative.   Allergic/Immunologic: Negative.   Neurological: Negative.   Hematological: Negative.   Psychiatric/Behavioral: Negative.     Exam:   BP 104/66 (BP Location: Right Arm, Patient Position: Sitting,  Cuff Size: Normal)   Pulse 80   Resp 14   Ht 5\' 4"  (1.626 m)   Wt 191 lb (86.6 kg)   LMP 04/26/2010   BMI 32.79 kg/m     General appearance: alert, cooperative and appears stated age Head: Normocephalic, without obvious abnormality, atraumatic Neck: no adenopathy, supple, symmetrical, trachea midline and thyroid normal to inspection and palpation Lungs: clear to auscultation bilaterally Breasts: normal appearance, no masses or tenderness, No nipple retraction or dimpling, No nipple discharge or bleeding, No axillary or supraclavicular adenopathy Heart: regular rate and rhythm Abdomen: soft, non-tender; no masses, no organomegaly Extremities: extremities normal, atraumatic, no cyanosis or edema Skin: Skin color, texture, turgor normal. No rashes or lesions Lymph nodes: Cervical, supraclavicular, and axillary nodes normal. No abnormal inguinal nodes palpated Neurologic: Grossly normal  Pelvic: External genitalia:  no lesions              Urethra:  normal appearing urethra with no masses, tenderness or lesions              Bartholins and Skenes: normal                 Vagina: normal appearing vagina with normal color and discharge, no lesions              Cervix: no lesions              Pap taken: No. Bimanual Exam:  Uterus:  normal size, contour, position, consistency, mobility, non-tender              Adnexa: no mass, fullness, tenderness              Rectal exam: Yes.  .  Confirms.              Anus:  normal sphincter tone, no lesions  Chaperone was present for exam.  Assessment:   Well woman visit with normal exam. Status post laparoscopic BSO.  HRT off.   Anxiety and OCD.  Off Zoloft.On Doxepin.   Plan: Mammogram screening.  We will schedule this and a BMD. Recommended self breast awareness. Pap and HR HPV as above. Guidelines for Calcium, Vitamin D, regular exercise program including cardiovascular and weight bearing exercise. Will get a copy of her labs and scan them  in.  Follow up annually and prn.   After visit summary provided.

## 2017-09-16 NOTE — Progress Notes (Signed)
Scheduled patient for [facility]17 at 1:30 pm. Patient declines earlier appointments offered.

## 2017-11-11 ENCOUNTER — Other Ambulatory Visit: Payer: BC Managed Care – PPO

## 2017-11-11 ENCOUNTER — Ambulatory Visit: Payer: BC Managed Care – PPO

## 2018-02-01 ENCOUNTER — Other Ambulatory Visit: Payer: BC Managed Care – PPO

## 2018-02-01 ENCOUNTER — Ambulatory Visit: Payer: BC Managed Care – PPO

## 2018-05-01 ENCOUNTER — Encounter: Payer: Self-pay | Admitting: Obstetrics and Gynecology

## 2018-05-01 NOTE — Telephone Encounter (Signed)
Information in this MyChart Message do not pertain to this specific patient. Patient notified by Thayer Ohm on 05/01/2018. Encounter closed.

## 2018-09-22 ENCOUNTER — Ambulatory Visit: Payer: BC Managed Care – PPO | Admitting: Obstetrics and Gynecology

## 2018-09-29 ENCOUNTER — Other Ambulatory Visit: Payer: Self-pay | Admitting: Obstetrics and Gynecology

## 2018-09-29 DIAGNOSIS — Z780 Asymptomatic menopausal state: Secondary | ICD-10-CM

## 2018-09-29 DIAGNOSIS — Z1231 Encounter for screening mammogram for malignant neoplasm of breast: Secondary | ICD-10-CM

## 2018-10-02 ENCOUNTER — Ambulatory Visit: Payer: BC Managed Care – PPO

## 2018-10-02 ENCOUNTER — Other Ambulatory Visit: Payer: BC Managed Care – PPO

## 2018-10-30 ENCOUNTER — Telehealth: Payer: Self-pay | Admitting: Obstetrics and Gynecology

## 2018-10-30 NOTE — Telephone Encounter (Signed)
Patient will need order for bone density scan fax to the breast center.

## 2018-11-02 NOTE — Telephone Encounter (Signed)
Order in Epic!

## 2018-11-08 ENCOUNTER — Ambulatory Visit: Payer: BC Managed Care – PPO | Admitting: Obstetrics and Gynecology

## 2019-01-09 ENCOUNTER — Ambulatory Visit: Payer: BC Managed Care – PPO | Admitting: Obstetrics and Gynecology

## 2019-01-10 ENCOUNTER — Other Ambulatory Visit: Payer: BC Managed Care – PPO

## 2019-01-10 ENCOUNTER — Ambulatory Visit: Payer: BC Managed Care – PPO

## 2019-03-26 ENCOUNTER — Other Ambulatory Visit: Payer: Self-pay

## 2019-03-26 ENCOUNTER — Ambulatory Visit
Admission: RE | Admit: 2019-03-26 | Discharge: 2019-03-26 | Disposition: A | Discharge status: Home / Self Care | Payer: BC Managed Care – PPO | Source: Ambulatory Visit | Attending: Obstetrics and Gynecology | Admitting: Obstetrics and Gynecology

## 2019-03-26 ENCOUNTER — Other Ambulatory Visit: Payer: BC Managed Care – PPO

## 2019-03-26 DIAGNOSIS — Z1231 Encounter for screening mammogram for malignant neoplasm of breast: Secondary | ICD-10-CM

## 2019-03-28 ENCOUNTER — Ambulatory Visit: Payer: BC Managed Care – PPO | Admitting: Obstetrics and Gynecology

## 2019-03-28 ENCOUNTER — Other Ambulatory Visit: Payer: Self-pay

## 2019-03-28 ENCOUNTER — Encounter: Payer: Self-pay | Admitting: Obstetrics and Gynecology

## 2019-03-28 VITALS — BP 124/76 | HR 80 | Temp 96.1°F | Resp 14 | Ht 63.5 in | Wt 198.2 lb

## 2019-03-28 DIAGNOSIS — Z01419 Encounter for gynecological examination (general) (routine) without abnormal findings: Secondary | ICD-10-CM

## 2019-03-28 DIAGNOSIS — Z1211 Encounter for screening for malignant neoplasm of colon: Secondary | ICD-10-CM | POA: Diagnosis not present

## 2019-03-28 MED ORDER — NYSTATIN 100000 UNIT/GM EX POWD: Freq: Three times a day (TID) | CUTANEOUS | 2 refills | Status: DC

## 2019-03-28 NOTE — Patient Instructions (Signed)

## 2019-03-28 NOTE — Progress Notes (Signed)
63 y.o. G0P0 Single Caucasian female here for annual exam.    Denies vaginal bleeding.   Doing weight loss through [employer]. Constipation improved with new diet.   Tapering off Lorazepam.  Started Melatonin.  PCP: Lujean Amel, MD    Patient's last menstrual period was 04/26/2010.           Sexually active: no The current method of family planning is post menopausal status and BSO.    Exercising: No.  some walking Smoker:  no  Health Maintenance: Pap: 06-30-16 Neg:Neg HR HPV, 04-01-14 Neg History of abnormal Pap:  no MMG:03-26-19 3D/Neg/density c/BiRads1 Colonoscopy:  NEVER BMD: 2014  Result :normal with orthopedic--was scheduled but the machine at [facility] is down at this time TDaP:  2016 Gardasil:   no HIV:no Hep C:no Screening labs:  PCP.  Flu vaccine:  Completed in August.    reports that she has never smoked. She has never used smokeless tobacco. She reports that she does not drink alcohol or use drugs.  Past Medical History:  Diagnosis Date  . Anxiety   . Asthma    only when ill with bronchitis  . Broken foot 2013   right  . Depression   . OCD (obsessive compulsive disorder)   . Retinal tear, bilateral    03/2016   . Thyroid disease 1994   hypothyroid--Dr. Darnelle Going  . Vitamin D deficiency     Past Surgical History:  Procedure Laterality Date  . DILATATION & CURRETTAGE/HYSTEROSCOPY WITH RESECTOCOPE N/A 10/18/2012   Procedure: DILATATION & CURETTAGE/HYSTEROSCOPY WITH RESECTOCOPE;  Surgeon: Arloa Koh, MD;  Location: [facility];  Service: Gynecology;  Laterality: N/A;  . LAPAROSCOPY Left 10/18/2012   Procedure: LEFT S&O;  Surgeon: Arloa Koh, MD;  Location: [facility];  Service: Gynecology;  Laterality: Left;  PELVIC WASHING  . RETINAL TEAR REPAIR CRYOTHERAPY Bilateral    03/2016  . SALPINGOOPHORECTOMY Bilateral 10/18/2012   Procedure: SALPINGO OOPHORECTOMY;  Surgeon: Arloa Koh, MD;  Location: [facility];  Service: Gynecology;  Laterality: Bilateral;  .  TONSILLECTOMY     age 54yrs  . TONSILLECTOMY AND ADENOIDECTOMY     age 80    Current Outpatient Medications  Medication Sig Dispense Refill  . Cholecalciferol (VITAMIN D3) 2000 units TABS Take 1 tablet by mouth 2 (two) times daily.    Marland Kitchen doxepin (SINEQUAN) 10 MG capsule Take 10 mg by mouth at bedtime.    Marland Kitchen doxepin (SINEQUAN) 50 MG capsule Take 1 capsule by mouth daily.    . fluticasone (FLONASE) 50 MCG/ACT nasal spray as needed.  1  . Fluticasone-Salmeterol (ADVAIR) 250-50 MCG/DOSE AEPB Inhale 1 puff into the lungs daily as needed. During respiratory illness or allergy flare    . ibuprofen (ADVIL,MOTRIN) 800 MG tablet Take 1 tablet (800 mg total) by mouth every 8 (eight) hours as needed for pain. 30 tablet 0  . LORazepam (ATIVAN) 1 MG tablet Take 1-2 tablets by mouth daily as needed.     . Melatonin 2.5 MG CHEW Chew 2 tablets by mouth at bedtime.    . Multiple Vitamins-Minerals (MULTIVITAMIN PO) Take 1 tablet by mouth daily.    Marland Kitchen SYNTHROID 112 MCG tablet Take 112 mcg by mouth daily.    . VENTOLIN HFA 108 (90 BASE) MCG/ACT inhaler as needed.  0  . vitamin C (ASCORBIC ACID) 500 MG tablet Take 500 mg by mouth daily.     No current facility-administered medications for this visit.     Family History  Problem Relation Age of Onset  . Osteoarthritis Mother   . Thyroid disease Mother   . Other Mother        fractured disc  . Lung cancer Father        mets to liver/dec'd age 79  . Hypertension Father   . Thyroid disease Maternal Grandmother     Review of Systems  All other systems reviewed and are negative.   Exam:   BP 124/76 (Cuff Size: Large)   Pulse 80   Temp (!) 96.1 F (35.6 C) (Temporal)   Resp 14   Ht 5' 3.5" (1.613 m)   Wt 198 lb 3.2 oz (89.9 kg)   LMP 04/26/2010   BMI 34.56 kg/m     General appearance: alert, cooperative and appears stated age Head: normocephalic, without obvious abnormality, atraumatic Neck: no adenopathy, supple, symmetrical, trachea midline and  thyroid normal to inspection and palpation Lungs: clear to auscultation bilaterally Breasts: normal appearance, no masses or tenderness, No nipple retraction or dimpling, No nipple discharge or bleeding, No axillary adenopathy Heart: regular rate and rhythm Abdomen: soft, non-tender; no masses, no organomegaly Extremities: extremities normal, atraumatic, no cyanosis or edema Skin: skin color, texture, turgor normal. No rashes or lesions Lymph nodes: cervical, supraclavicular, and axillary nodes normal. Neurologic: grossly normal  Pelvic: External genitalia:  no lesions              No abnormal inguinal nodes palpated.              Urethra:  normal appearing urethra with no masses, tenderness or lesions              Bartholins and Skenes: normal                 Vagina: normal appearing vagina with normal color and discharge, no lesions              Cervix: no lesions              Pap taken: No. Bimanual Exam:  Uterus:  normal size, contour, position, consistency, mobility, non-tender              Adnexa: no mass, fullness, tenderness              Rectal exam: Yes.  .  Confirms.              Anus:  normal sphincter tone, no lesions  Chaperone was present for exam.  Assessment:   Well woman visit with normal exam. Status post laparoscopic BSO.  HRT off.   Anxiety and OCD.  Off Zoloft. On Doxepin.  Colon cancer screening.   Plan: Mammogram screening discussed. Self breast awareness reviewed. Pap and HR HPV in 2023.  New guidelines discussed. Guidelines for Calcium, Vitamin D, regular exercise program including cardiovascular and weight bearing exercise. IFOB.  Patient declines colonoscopy.  She will reschedule her BMD. Follow up annually and prn.   After visit summary provided.

## 2019-04-27 DIAGNOSIS — M170 Bilateral primary osteoarthritis of knee: Secondary | ICD-10-CM

## 2019-04-27 HISTORY — DX: Bilateral primary osteoarthritis of knee: M17.0

## 2019-10-25 HISTORY — PX: KNEE ARTHROSCOPY W/ MENISCAL REPAIR: SHX1877

## 2020-03-31 ENCOUNTER — Ambulatory Visit: Payer: BC Managed Care – PPO | Admitting: Obstetrics and Gynecology

## 2020-07-02 IMAGING — MG DIGITAL SCREENING BILAT W/ TOMO W/ CAD
8 series · 8 of 24 positions shown · non-contrast
Comparison: Previous exam(s).

CLINICAL DATA: Screening.

EXAM:
DIGITAL SCREENING BILATERAL MAMMOGRAM WITH TOMO AND CAD

[R MLO synth-2D]
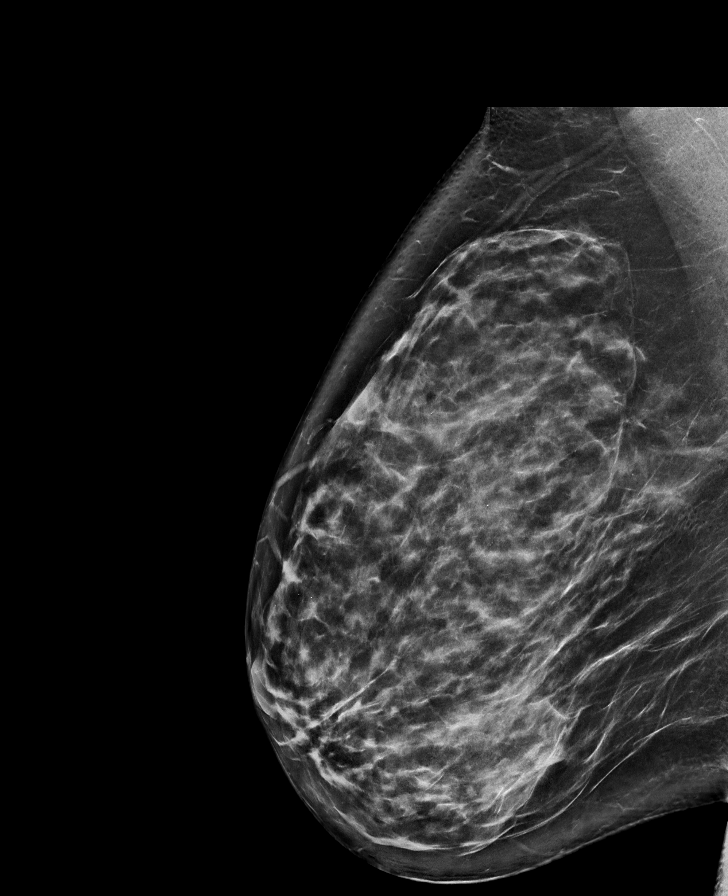

[L MLO synth-2D]
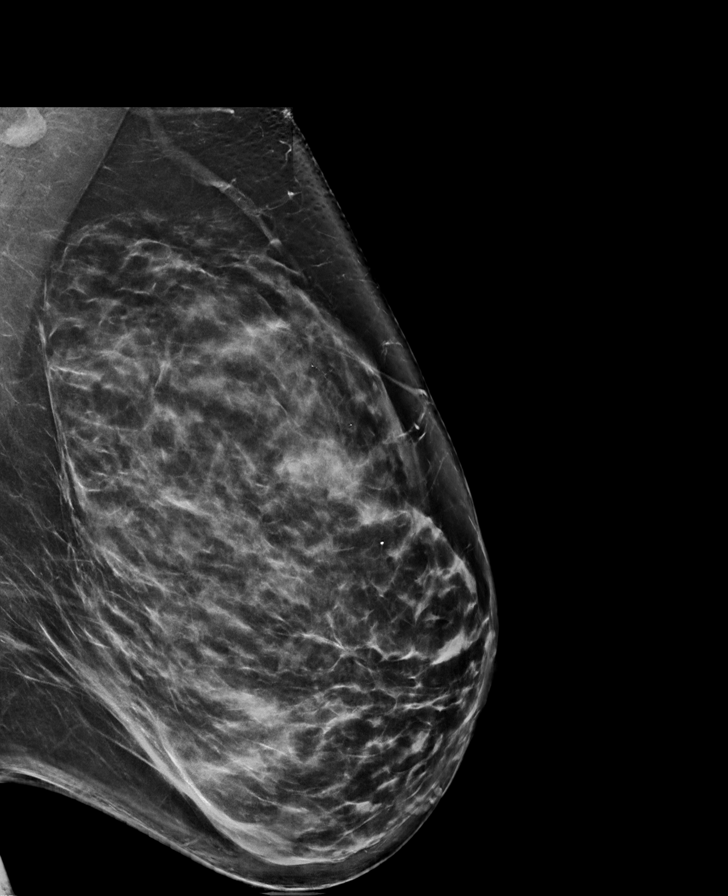

[L CC synth-2D]
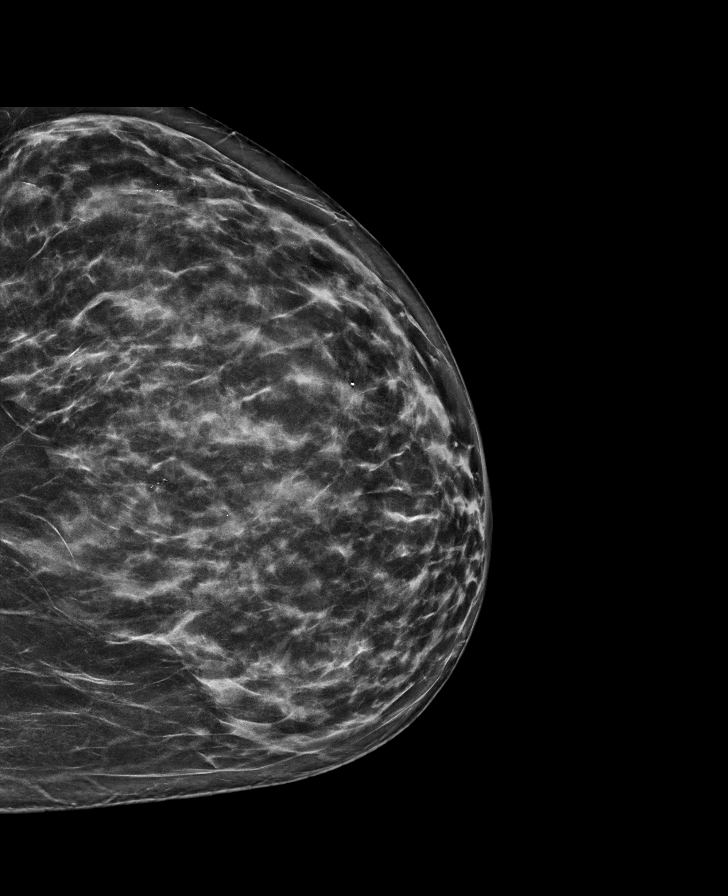

[R CC synth-2D]
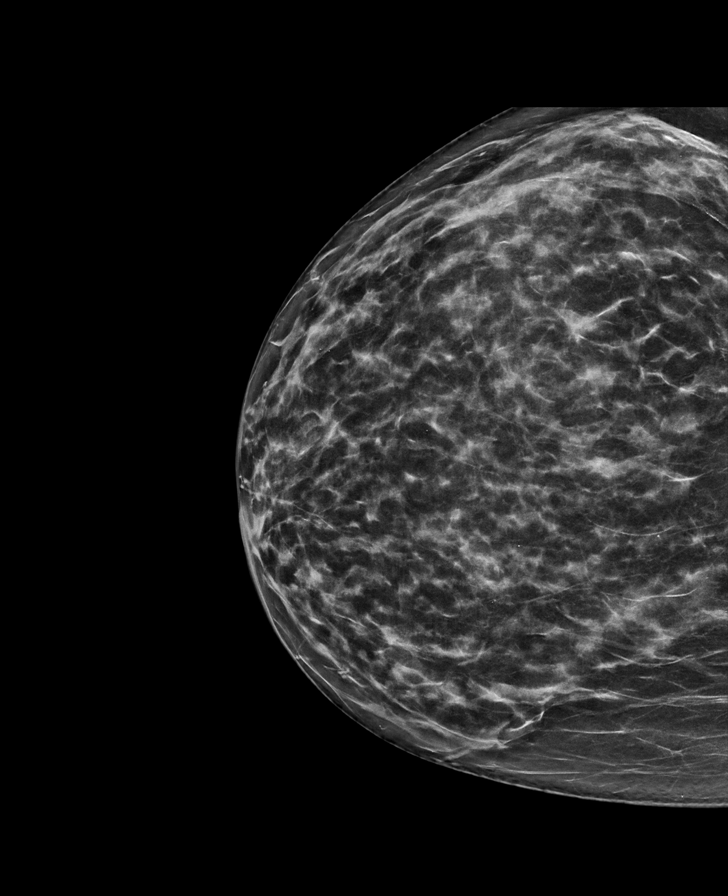

[R MLO tomo · tomo slice 49/96.0]
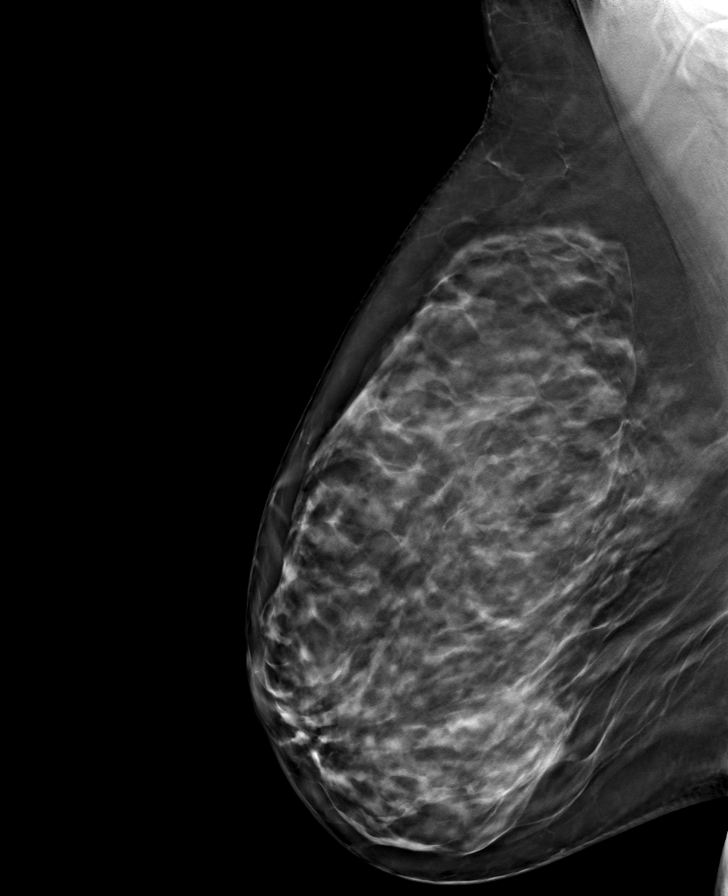

[L CC tomo · tomo slice 41/82.0]
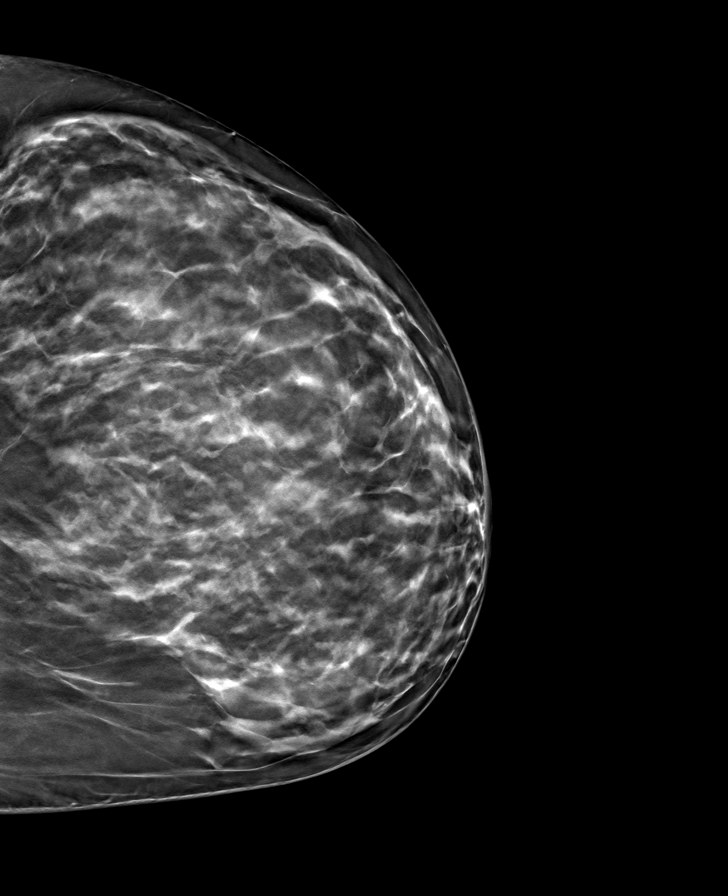

[R CC tomo · tomo slice 37/72.0]
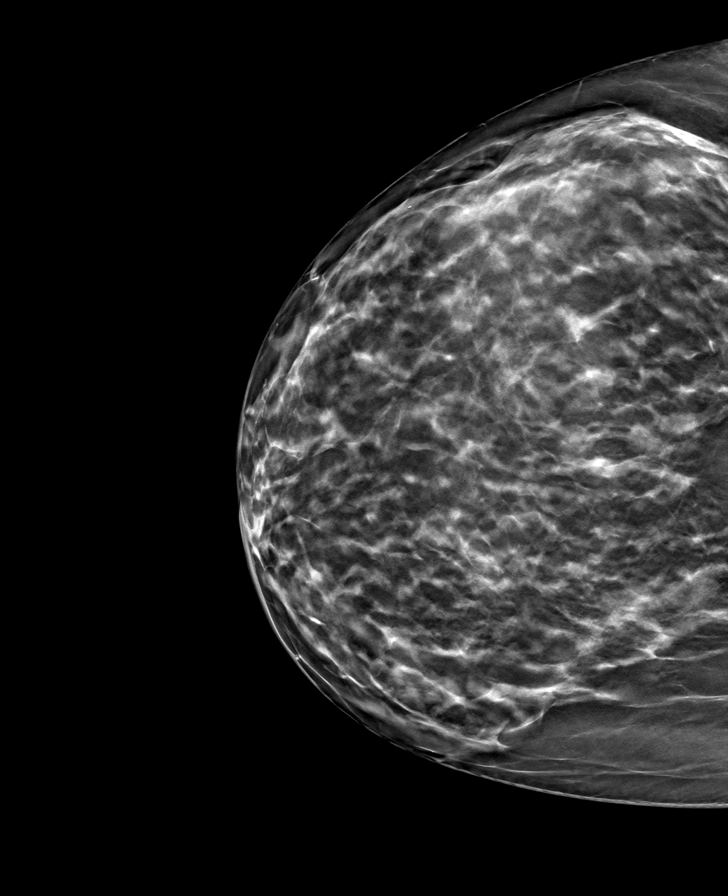

[L MLO tomo · tomo slice 49/96.0]
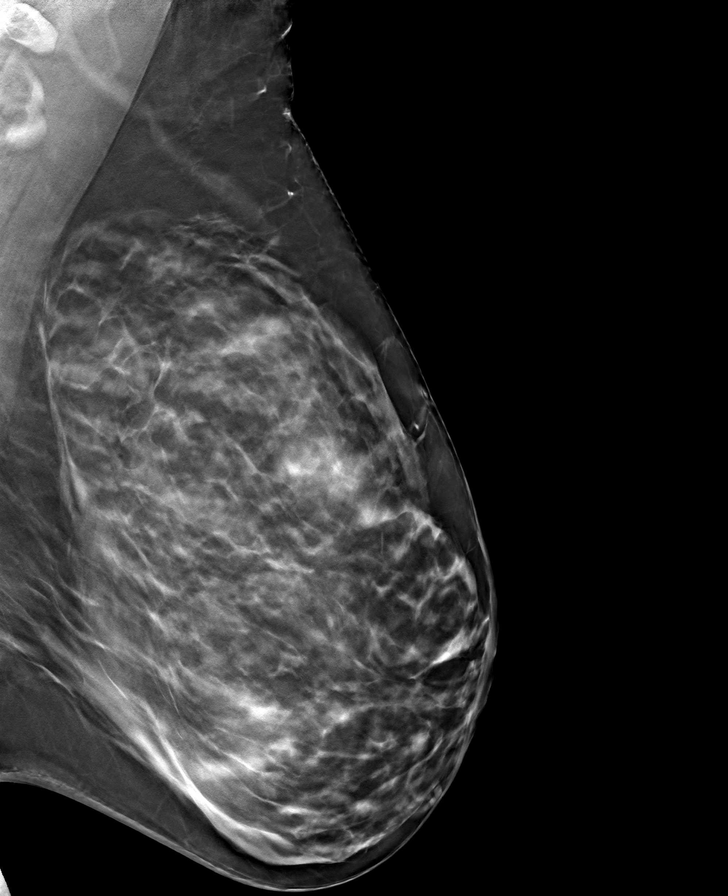

[8 of 24 positions shown; findings below may reference images not displayed]

ACR Breast Density Category c: The breast tissue is heterogeneously
dense, which may obscure small masses.
FINDINGS: There are no findings suspicious for malignancy. Images were
processed with CAD.
IMPRESSION: No mammographic evidence of malignancy. A result letter of this
screening mammogram will be mailed directly to the patient.

RECOMMENDATION:
Screening mammogram in one year. (Code:FT-U-LHB)

BI-RADS CATEGORY  1: Negative.

## 2020-08-12 NOTE — Progress Notes (Signed)
65 y.o. G0P0 Single Caucasian female here for annual exam.    Recent respiratory infection.  Test negative for Covid.   Has joined weight [employer] and using stationary bike. Losing weight.   Still has some hot flashes in the evening.  Thinks the Doxepin is helping.   Had knee surgery.  Dealing with arthritis.  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  PCP: Lujean Amel, MD   Endocrinology:  Dagmar Hait, MD  Patient's last menstrual period was 04/26/2010.           Sexually active: No. Never The current method of family planning is post menopausal status.    Exercising: Yes.    stationary bike Smoker:  no  Health Maintenance: Pap: 06-30-16 Neg:Neg HR HPV, 04-01-14 Neg History of abnormal Pap:  no MMG: 03-26-19 3D/Neg/BiRads1--knows she needs to schedule Colonoscopy: NEVER.  She will check with insurance to see if she can do Cologuard. BMD: 2014  Result :normal with orthopedic TDaP:  2016 Gardasil:   no HIV:no Hep C:no Screening Labs:  PCP.   reports that she has never smoked. She has never used smokeless tobacco. She reports that she does not drink alcohol and does not use drugs.  Past Medical History:  Diagnosis Date  . Anxiety   . Arthritis of both knees 2021  . Asthma    only when ill with bronchitis  . Broken foot 2013   right  . Depression   . OCD (obsessive compulsive disorder)   . Retinal tear, bilateral    03/2016   . Thyroid disease 1994   hypothyroid--Dr. Darnelle Going  . Vitamin D deficiency     Past Surgical History:  Procedure Laterality Date  . DILATATION & CURRETTAGE/HYSTEROSCOPY WITH RESECTOCOPE N/A 10/18/2012   Procedure: DILATATION & CURETTAGE/HYSTEROSCOPY WITH RESECTOCOPE;  Surgeon: Arloa Koh, MD;  Location: [facility];  Service: Gynecology;  Laterality: N/A;  . KNEE ARTHROSCOPY W/ MENISCAL REPAIR Left 10/2019  . LAPAROSCOPY Left 10/18/2012   Procedure: LEFT S&O;  Surgeon: Arloa Koh, MD;  Location: [facility];  Service: Gynecology;  Laterality:  Left;  PELVIC WASHING  . RETINAL TEAR REPAIR CRYOTHERAPY Bilateral    03/2016  . SALPINGOOPHORECTOMY Bilateral 10/18/2012   Procedure: SALPINGO OOPHORECTOMY;  Surgeon: Arloa Koh, MD;  Location: [facility];  Service: Gynecology;  Laterality: Bilateral;  . TONSILLECTOMY     age 89yrs  . TONSILLECTOMY AND ADENOIDECTOMY     age 18    Current Outpatient Medications  Medication Sig Dispense Refill  . Cholecalciferol (VITAMIN D3) 2000 units TABS Take 1 tablet by mouth 2 (two) times daily.    Marland Kitchen doxepin (SINEQUAN) 10 MG capsule Take 10 mg by mouth at bedtime.    Marland Kitchen doxepin (SINEQUAN) 50 MG capsule Take 1 capsule by mouth daily.    . fexofenadine (ALLEGRA) 60 MG tablet 1 tablet    . fluticasone (FLONASE) 50 MCG/ACT nasal spray as needed.  1  . Fluticasone-Salmeterol (ADVAIR) 250-50 MCG/DOSE AEPB Inhale 1 puff into the lungs daily as needed. During respiratory illness or allergy flare    . ibuprofen (ADVIL,MOTRIN) 800 MG tablet Take 1 tablet (800 mg total) by mouth every 8 (eight) hours as needed for pain. 30 tablet 0  . LORazepam (ATIVAN) 0.5 MG tablet TAKE 1 TABLET BY MOUTH ONCE A DAY IF NEEDED    . Melatonin 2.5 MG CHEW Chew 2 tablets by mouth at bedtime.    . Multiple Vitamins-Minerals (MULTIVITAMIN PO) Take 1 tablet by mouth daily.    Marland Kitchen  nystatin (MYCOSTATIN/NYSTOP) powder Apply topically 3 (three) times daily. Apply to affected area for up to 7 days 30 g 2  . SYNTHROID 112 MCG tablet Take 112 mcg by mouth daily.    . VENTOLIN HFA 108 (90 BASE) MCG/ACT inhaler as needed.  0  . vitamin C (ASCORBIC ACID) 500 MG tablet Take 500 mg by mouth daily.     No current facility-administered medications for this visit.    Family History  Problem Relation Age of Onset  . Osteoarthritis Mother   . Thyroid disease Mother   . Other Mother        fractured disc  . Lung cancer Father        mets to liver/dec'd age 10  . Hypertension Father   . Thyroid disease Maternal Grandmother     Review of Systems   All other systems reviewed and are negative.   Exam:   BP 118/76   Pulse 71   Ht 5' 3.5" (1.613 m)   Wt 183 lb (83 kg)   LMP 04/26/2010   SpO2 97%   BMI 31.91 kg/m     General appearance: alert, cooperative and appears stated age Head: normocephalic, without obvious abnormality, atraumatic Neck: no adenopathy, supple, symmetrical, trachea midline and thyroid normal to inspection and palpation Lungs: clear to auscultation bilaterally Breasts: normal appearance, no masses or tenderness, No nipple retraction or dimpling, No nipple discharge or bleeding, No axillary adenopathy Heart: regular rate and rhythm Abdomen: soft, non-tender; no masses, no organomegaly Extremities: extremities normal, atraumatic, no cyanosis or edema Skin: skin color, texture, turgor normal. No rashes or lesions Lymph nodes: cervical, supraclavicular, and axillary nodes normal. Neurologic: grossly normal  Pelvic: External genitalia:  no lesions              No abnormal inguinal nodes palpated.              Urethra:  normal appearing urethra with no masses, tenderness or lesions              Bartholins and Skenes: normal                 Vagina: normal appearing vagina with normal color and discharge, no lesions              Cervix: no lesions              Pap taken: No. Bimanual Exam:  Uterus:  normal size, contour, position, consistency, mobility, non-tender              Adnexa: no mass, fullness, tenderness              Rectal exam: Yes.  .  Confirms.              Anus:  normal sphincter tone, no lesions  Chaperone was present for exam.  Assessment:   Well woman visit with normal exam. Status post laparoscopic BSO.  HRToff.  Anxiety and OCD.  On Doxepin.  Colon cancer screening.   Plan: Mammogram screening discussed.  She will schedule.  Self breast awareness reviewed. Pap and HR HPV as above. Guidelines for Calcium, Vitamin D, regular exercise program including cardiovascular and weight  bearing exercise. Labs with PCP and endocrinology.  She will check on her benefits for Cologuard. BMD in 2 years.  Follow up annually and prn.

## 2020-08-13 ENCOUNTER — Other Ambulatory Visit: Payer: Self-pay

## 2020-08-13 ENCOUNTER — Ambulatory Visit (INDEPENDENT_AMBULATORY_CARE_PROVIDER_SITE_OTHER): Payer: BC Managed Care – PPO | Admitting: Obstetrics and Gynecology

## 2020-08-13 ENCOUNTER — Encounter: Payer: Self-pay | Admitting: Obstetrics and Gynecology

## 2020-08-13 VITALS — BP 118/76 | HR 71 | Ht 63.5 in | Wt 183.0 lb

## 2020-08-13 DIAGNOSIS — Z01419 Encounter for gynecological examination (general) (routine) without abnormal findings: Secondary | ICD-10-CM

## 2020-08-13 NOTE — Patient Instructions (Signed)

## 2020-08-15 ENCOUNTER — Other Ambulatory Visit: Payer: Self-pay | Admitting: Family Medicine

## 2020-08-15 DIAGNOSIS — R1011 Right upper quadrant pain: Secondary | ICD-10-CM

## 2020-08-20 ENCOUNTER — Ambulatory Visit
Admission: RE | Admit: 2020-08-20 | Discharge: 2020-08-20 | Disposition: A | Discharge status: Home / Self Care | Payer: BC Managed Care – PPO | Source: Ambulatory Visit | Attending: Family Medicine | Admitting: Family Medicine

## 2020-08-20 DIAGNOSIS — R1011 Right upper quadrant pain: Secondary | ICD-10-CM

## 2020-09-24 ENCOUNTER — Ambulatory Visit: Payer: Self-pay | Admitting: Surgery

## 2020-09-24 NOTE — H&P (Signed)
History of Present Illness (Daisy Sherpa L. Daisy [name]; 09/24/2020 1:29 PM) The patient is a 65 year old female who presents for evaluation of gall stones.HPI: Daisy [name] is a 65 yo female who was referred to discuss cholecystectomy. In April she had an episode of epigastric discomfort, belching and bloating shortly after eating. The symptoms later resolved but she intermittently had upper abdominal/RUQ discomfort over the next few days. Since then she has not had any recurrent symptoms, and prior to that had not had any similar episodes. She was seen by her PCP, who referred her for a RUQ [place]. This showed a large 2cm gallstone but no signs of acute cholecystitis. She is here today to discuss surgery. She has had to undergo several surgeries in recent years, most notably a knee surgery, and reports significant postop nausea/vomiting. Because of this she is hesitant to undergo further procedures.  PMH: hypothyroidism, anxiety/depression, bronchitis  PSH: dilatation and currettage, laparoscopic salpingoophorectomy, tonsillectomy  FHx: thyroid disease, OA, HTN  Social: Denies use of tobacco/EtOH/drugs. 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    Past Surgical History Daisy Forehand, CNA; 09/24/2020 10:19 AM) Tonsillectomy   Diagnostic Studies History Daisy Forehand, CNA; 09/24/2020 10:19 AM) Colonoscopy  never Mammogram  1-3 years ago Pap Smear  1-5 years ago  Allergies Daisy Forehand, CNA; 09/24/2020 10:33 AM) Allergies Reconciled  penicillAMINE *CHEMICALS*  Thimerosal *ANTISEPTICS & DISINFECTANTS*  Sudafed *NASAL AGENTS - SYSTEMIC AND TOPICAL*   Medication History Daisy Forehand, CNA; 09/24/2020 10:33 AM) LORazepam (0.5MG  Tablet, Oral) Active. Albuterol Sulfate HFA (108 (90 Base)MCG/ACT Aerosol Soln, Inhalation) Active. Azithromycin (250MG  Tablet, Oral) Active. Doxepin HCl (10MG  Capsule, Oral) Active. Ibuprofen (800MG  Tablet, Oral) Active. Synthroid (112MCG Tablet, Oral)  Active. Wixela Inhub (250-50MCG/DOSE Aero Pow Br Act, Inhalation) Active. Medications Reconciled  Social History Daisy Forehand, CNA; 09/24/2020 10:19 AM) No alcohol use  No caffeine use  No drug use  Tobacco use  Never smoker.  Family History Daisy Forehand, CNA; 09/24/2020 10:19 AM) Anesthetic complications  Mother. Arthritis  Mother. Depression  Mother. Thyroid problems  Mother.  Pregnancy / Birth History Daisy Forehand, CNA; 09/24/2020 10:19 AM) Age at menarche  1 years. Age of menopause  77-50 Gravida  0 Para  0  Other Problems Daisy Forehand, CNA; 09/24/2020 10:19 AM) Anxiety Disorder  Arthritis  Back Pain  Cholelithiasis  Depression  General anesthesia - complications  Oophorectomy     Review of Systems Daisy Reams Alston CNA; 09/24/2020 10:19 AM) General Not Present- Appetite Loss, Chills, Fatigue, Fever, Night Sweats, Weight Gain and Weight Loss. Skin Not Present- Change in Wart/Mole, Dryness, Hives, Jaundice, New Lesions, Non-Healing Wounds, Rash and Ulcer. HEENT Present- Hoarseness, Ringing in the Ears and Wears glasses/contact lenses. Not Present- Earache, Hearing Loss, Nose Bleed, Oral Ulcers, Seasonal Allergies, Sinus Pain, Sore Throat, Visual Disturbances and Yellow Eyes. Psychiatric Present- Anxiety. Not Present- Bipolar, Change in Sleep Pattern, Depression, Fearful and Frequent crying.  Vitals (Daisy Alston CNA; 09/24/2020 10:34 AM) 09/24/2020 10:33 AM Weight: 189.13 lb Height: 64in Body Surface Area: 1.91 m Body Mass Index: 32.46 kg/m  Temp.: 97.64F  Pulse: 75 (Regular)  P.OX: 97% (Room air) BP: 132/80(Sitting, Left Arm, Standard)       Physical Exam (Daisy [name] L. Daisy [name]; 09/24/2020 1:29 PM) The physical exam findings are as follows: Note: Constitutional: No acute distress; conversant; no deformities Neuro: alert and oriented; cranial nerves grossly in tact; no focal deficits Eyes: Moist conjunctiva; anicteric  sclerae; extraocular movements in tact Neck: Trachea midline  Lungs: Normal respiratory effort; lungs clear to auscultation bilaterally; symmetric chest wall expansion CV: Regular rate and rhythm; no murmurs; no pitting edema GI: Abdomen soft, nontender, nondistended; no masses or organomegaly; well-healed laparoscopic surgical scars MSK: Normal gait and station; no clubbing/cyanosis Psychiatric: Appropriate affect; alert and oriented 3    Assessment & Plan (Jaan Fischel L. Daisy [name]; 09/24/2020 1:36 PM) SYMPTOMATIC CHOLELITHIASIS (K80.20) Story: 65 yo female presenting with epigastric/RUQ discomfort with cholelithiasis. I personally reviewed her referral notes and her [place]. She has a large stone measuring just over 2cm in the gallbladder. I think her symptoms were most likely secondary to her cholelithiasis. I discussed that once gallstones become symptomatic, she is likely to have further symptoms and there is an increased risk of further complications such as cholecystitis. Thus I do recommend cholecystectomy, although I gave her the option of expectant management if she is strongly opposed to surgery. She is somewhat hesitant to undergo surgery and reports this is primarily because of her previous side effects of anesthesia with PONV. She would like to prevent further symptoms from her gallstones though and would like to go ahead with cholecystectomy. I discussed the procedure in detail and explained the risks and benefits including bleeding, infection and <0.5% risk of common bile duct injury. She expressed understanding and agrees to proceed. I assured her she will be able to discuss her concerns with anesthesia prior to surgery and hopefully minimize postop symptoms. Will plan to do this as an outpatient but can admit her overnight if necessary if she has severe nausea/vomiting. 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 She will be contacted to  schedule a surgery date.  Michaelle Birks, [facility], Hepatobiliary and Pancreatic Surgery 09/24/20 1:37 PM

## 2020-10-31 NOTE — Progress Notes (Signed)
Surgical Instructions    Your procedure is scheduled on Friday July 15th.  Report to [facility] Main Entrance "A" at 07:00 A.M., then check in with the Admitting office.  Call this number if you have problems the morning of surgery:  [phone]   If you have any questions prior to your surgery date call [phone]: Open Monday-Friday 8am-4pm    Remember:  Do not eat or drink after midnight the night before your surgery    Take these medicines the morning of surgery with A SIP OF WATER   fexofenadine (ALLEGRA)- If needed  fluticasone (FLONASE)- If needed  Fluticasone-Salmeterol (ADVAIR)- If needed  LORazepam (ATIVAN)- If needed  VENTOLIN HFA 108- If needed   SYNTHROID  As of today, STOP taking any Aspirin (unless otherwise instructed by your surgeon) Aleve, Naproxen, Ibuprofen, Motrin, Advil, Goody's, BC's, all herbal medications, fish oil, and all vitamins.                     Do NOT Smoke (Tobacco/Vaping) or drink Alcohol 24 hours prior to your procedure.  If you use a CPAP at night, you may bring all equipment for your overnight stay.   Contacts, glasses, piercing's, hearing aid's, dentures or partials may not be worn into surgery, please bring cases for these belongings.    For patients admitted to the hospital, discharge time will be determined by your treatment team.   Patients discharged the day of surgery will not be allowed to drive home, and someone needs to stay with them for 24 hours.  ONLY 1 SUPPORT PERSON MAY BE PRESENT WHILE YOU ARE IN SURGERY. IF YOU ARE TO BE ADMITTED ONCE YOU ARE IN YOUR ROOM YOU WILL BE ALLOWED TWO (2) VISITORS.  Minor children may have two parents present. Special consideration for safety and communication needs will be reviewed on a case by case basis.   Special instructions:   [facility]- Preparing For Surgery  Before surgery, you can play an important role. Because skin is not sterile, your skin needs to be as free of germs as  possible. You can reduce the number of germs on your skin by washing with CHG (chlorahexidine gluconate) Soap before surgery.  CHG is an antiseptic cleaner which kills germs and bonds with the skin to continue killing germs even after washing.    Oral Hygiene is also important to reduce your risk of infection.  Remember - BRUSH YOUR TEETH THE MORNING OF SURGERY WITH YOUR REGULAR TOOTHPASTE  Please do not use if you have an allergy to CHG or antibacterial soaps. If your skin becomes reddened/irritated stop using the CHG.  Do not shave (including legs and underarms) for at least 48 hours prior to first CHG shower. It is OK to shave your face.  Please follow these instructions carefully.   Shower the NIGHT BEFORE SURGERY and the MORNING OF SURGERY  If you chose to wash your hair, wash your hair first as usual with your normal shampoo.  After you shampoo, rinse your hair and body thoroughly to remove the shampoo.  Use CHG Soap as you would any other liquid soap. You can apply CHG directly to the skin and wash gently with a scrungie or a clean washcloth.   Apply the CHG Soap to your body ONLY FROM THE NECK DOWN.  Do not use on open wounds or open sores. Avoid contact with your eyes, ears, mouth and genitals (private parts). Wash Face and genitals (private parts)  with  your normal soap.   Wash thoroughly, paying special attention to the area where your surgery will be performed.  Thoroughly rinse your body with warm water from the neck down.  DO NOT shower/wash with your normal soap after using and rinsing off the CHG Soap.  Pat yourself dry with a CLEAN TOWEL.  Wear CLEAN PAJAMAS to bed the night before surgery  Place CLEAN SHEETS on your bed the night before your surgery  DO NOT SLEEP WITH PETS.   Day of Surgery: Shower with CHG soap. Do not wear jewelry, make up, nail polish, gel polish, artificial nails, or any other type of covering on natural nails including finger and  toenails. If patients have artificial nails, gel coating, etc. that need to be removed by a nail salon please have this removed prior to surgery. Surgery may need to be canceled/delayed if the surgeon/ anesthesia feels like the patient is unable to be adequately monitored. Do not wear lotions, powders, perfumes/colognes, or deodorant. Do not shave 48 hours prior to surgery.  Men may shave face and neck. Do not bring valuables to the hospital. [facility] is not responsible for any belongings or valuables. Wear Clean/Comfortable clothing the morning of surgery Remember to brush your teeth WITH YOUR REGULAR TOOTHPASTE.   Please read over the following fact sheets that you were given.

## 2020-11-03 ENCOUNTER — Encounter (HOSPITAL_COMMUNITY)
Admission: RE | Admit: 2020-11-03 | Discharge: 2020-11-03 | Disposition: A | Discharge status: Home / Self Care | Payer: BC Managed Care – PPO | Source: Ambulatory Visit | Attending: Surgery | Admitting: Surgery

## 2020-11-03 ENCOUNTER — Other Ambulatory Visit: Payer: Self-pay

## 2020-11-03 ENCOUNTER — Encounter (HOSPITAL_COMMUNITY): Payer: Self-pay

## 2020-11-03 DIAGNOSIS — Z01812 Encounter for preprocedural laboratory examination: Secondary | ICD-10-CM | POA: Diagnosis not present

## 2020-11-03 HISTORY — DX: Nausea with vomiting, unspecified: R11.2

## 2020-11-03 HISTORY — DX: Family history of other specified conditions: Z84.89

## 2020-11-03 HISTORY — DX: Hypothyroidism, unspecified: E03.9

## 2020-11-03 HISTORY — DX: Bronchitis, not specified as acute or chronic: J40

## 2020-11-03 HISTORY — DX: Other specified postprocedural states: Z98.890

## 2020-11-03 LAB — HEMOGLOBIN A1C
Hgb A1c MFr Bld: 5.8 % — ABNORMAL HIGH (ref 4.8–5.6)
Mean Plasma Glucose: 119.76 mg/dL

## 2020-11-03 LAB — CBC
HCT: 43.2 % (ref 36.0–46.0)
Hemoglobin: 13.7 g/dL (ref 12.0–15.0)
MCH: 28.7 pg (ref 26.0–34.0)
MCHC: 31.7 g/dL (ref 30.0–36.0)
MCV: 90.6 fL (ref 80.0–100.0)
Platelets: 268 10*3/uL (ref 150–400)
RBC: 4.77 MIL/uL (ref 3.87–5.11)
RDW: 13.9 % (ref 11.5–15.5)
WBC: 7.2 10*3/uL (ref 4.0–10.5)
nRBC: 0 % (ref 0.0–0.2)

## 2020-11-03 NOTE — Progress Notes (Addendum)
PCP - Jamie Kato, MD;  records requested Cardiologist - Denies Endocrinologist- Delrae Rend, MD  PPM/ICD - Denies  Chest x-ray - N/A EKG - N/A Stress Test -  Denies ECHO -  Denies Cardiac Cath -  Denies  Sleep Study -  Denies   Per Patient, she is not diabetic. Per pt, Last A1C was 5.9 in 2021   Blood Thinner Instructions: N/A Aspirin Instructions: N/A  ERAS Protcol - N/A PRE-SURGERY Ensure or G2- N/A  COVID TEST- N/A; Amblatory sx.   Anesthesia review: Yes, review last office note and labs from PCP  Patient denies shortness of breath, fever, cough and chest pain at PAT appointment   All instructions explained to the patient, with a verbal understanding of the material. Patient agrees to go over the instructions while at home for a better understanding. Patient also instructed to self quarantine after being tested for COVID-19. The opportunity to ask questions was provided.

## 2020-11-05 NOTE — Progress Notes (Signed)
Anesthesia Chart Review:  Case: [id number] Date/Time: 11/07/20 0845   Procedure: LAPAROSCOPIC CHOLECYSTECTOMY   Anesthesia type: General   Pre-op diagnosis: GALLSTONES   Location: [facility] ROOM 02 / [place] [facility]   Surgeons: Dwan Bolt, MD       DISCUSSION: Patient is a 65 year old female scheduled for the above procedure.  History includes never smoker, postoperative N/V, OCD, anxiety, hypothyroidism.  Anesthesia team to evaluate on the day of surgery.  VS: BP (!) 159/80   Pulse 82   Temp 36.5 C (Oral)   Resp 18   Ht 5' 3.5" (1.613 m)   Wt 85.2 kg   LMP 04/26/2010   SpO2 98%   BMI 32.75 kg/m   PROVIDERS: Lujean Amel, MD is PCP  Delrae Rend, MD is endocrinologist   LABS: Labs reviewed: Acceptable for surgery. (all labs ordered are listed, but only abnormal results are displayed)  Labs Reviewed  HEMOGLOBIN A1C - Abnormal; Notable for the following components:      Result Value   Hgb A1c MFr Bld 5.8 (*)    All other components within normal limits  CBC     IMAGES: [place] Abd 08/20/20: IMPRESSION: 1. Cholelithiasis without sonographic evidence of acute cholecystitis. 2. The echogenicity of the liver is increased. This is a nonspecific finding but is most commonly seen with fatty infiltration of the liver. There are no obvious focal liver lesions.    EKG: N/A   CV: N/A  Past Medical History:  Diagnosis Date   Anxiety    Arthritis of both knees 2021   Broken foot 2013   right   Bronchitis    Depression    Family history of adverse reaction to anesthesia    mother with PONV after gallbladder removal   Hypothyroidism    OCD (obsessive compulsive disorder)    PONV (postoperative nausea and vomiting)    after D&C   Retinal tear, bilateral    03/2016    Thyroid disease 1994   hypothyroid--Dr. Darnelle Going   Vitamin D deficiency     Past Surgical History:  Procedure Laterality Date   DILATATION & CURRETTAGE/HYSTEROSCOPY WITH RESECTOCOPE N/A  10/18/2012   Procedure: DILATATION & CURETTAGE/HYSTEROSCOPY WITH RESECTOCOPE;  Surgeon: Arloa Koh, MD;  Location: [facility];  Service: Gynecology;  Laterality: N/A;   DILATION AND CURETTAGE OF UTERUS     EYE SURGERY     KNEE ARTHROSCOPY W/ MENISCAL REPAIR Left 10/2019   LAPAROSCOPY Left 10/18/2012   Procedure: LEFT S&O;  Surgeon: Arloa Koh, MD;  Location: [facility];  Service: Gynecology;  Laterality: Left;  PELVIC WASHING   RETINAL TEAR REPAIR CRYOTHERAPY Bilateral    03/2016   SALPINGOOPHORECTOMY Bilateral 10/18/2012   Procedure: SALPINGO OOPHORECTOMY;  Surgeon: Arloa Koh, MD;  Location: [facility];  Service: Gynecology;  Laterality: Bilateral;   TONSILLECTOMY     age 64yrs   TONSILLECTOMY AND ADENOIDECTOMY     age 49    MEDICATIONS:  Cholecalciferol (VITAMIN D3) 2000 units TABS   doxepin (SINEQUAN) 50 MG capsule   fexofenadine (ALLEGRA) 60 MG tablet   fluticasone (FLONASE) 50 MCG/ACT nasal spray   Fluticasone-Salmeterol (ADVAIR) 250-50 MCG/DOSE AEPB   ibuprofen (ADVIL,MOTRIN) 800 MG tablet   LORazepam (ATIVAN) 0.5 MG tablet   melatonin 5 MG TABS   Multiple Vitamins-Minerals (MULTIVITAMIN PO)   nystatin (MYCOSTATIN/NYSTOP) powder   SYNTHROID 112 MCG tablet   VENTOLIN HFA 108 (90 BASE) MCG/ACT inhaler   vitamin C (ASCORBIC ACID) 500 MG  tablet   No current facility-administered medications for this encounter.   Myra Gianotti, PA-C Surgical Short Stay/Anesthesiology [facility] Phone [phone] [facility] Phone [phone] 11/05/2020 1:58 PM

## 2020-11-05 NOTE — Anesthesia Preprocedure Evaluation (Addendum)
Anesthesia Evaluation  Patient identified by MRN, date of birth, ID band Patient awake    Reviewed: Allergy & Precautions, NPO status , Patient's Chart, lab work & pertinent test results  History of Anesthesia Complications (+) PONV  Airway Mallampati: II  TM Distance: >3 FB Neck ROM: Full    Dental no notable dental hx.    Pulmonary neg pulmonary ROS,    Pulmonary exam normal breath sounds clear to auscultation       Cardiovascular negative cardio ROS Normal cardiovascular exam Rhythm:Regular Rate:Normal     Neuro/Psych negative neurological ROS  negative psych ROS   GI/Hepatic negative GI ROS, Neg liver ROS,   Endo/Other  Hypothyroidism   Renal/GU negative Renal ROS  negative genitourinary   Musculoskeletal negative musculoskeletal ROS (+)   Abdominal   Peds negative pediatric ROS (+)  Hematology negative hematology ROS (+)   Anesthesia Other Findings   Reproductive/Obstetrics negative OB ROS                             Anesthesia Physical Anesthesia Plan  ASA: 2  Anesthesia Plan: General   Post-op Pain Management:    Induction: Intravenous  PONV Risk Score and Plan: 4 or greater and Ondansetron, Dexamethasone, Scopolamine patch - Pre-op, Midazolam, Treatment may vary due to age or medical condition and Droperidol  Airway Management Planned: Oral ETT  Additional Equipment:   Intra-op Plan:   Post-operative Plan: Extubation in OR  Informed Consent: I have reviewed the patients History and Physical, chart, labs and discussed the procedure including the risks, benefits and alternatives for the proposed anesthesia with the patient or authorized representative who has indicated his/her understanding and acceptance.     Dental advisory given  Plan Discussed with: CRNA and Surgeon  Anesthesia Plan Comments: (PAT note written 11/05/2020 by Myra Gianotti, PA-C. )        Anesthesia Quick Evaluation

## 2020-11-07 ENCOUNTER — Ambulatory Visit (HOSPITAL_COMMUNITY): Payer: BC Managed Care – PPO | Admitting: Physician Assistant

## 2020-11-07 ENCOUNTER — Other Ambulatory Visit: Payer: Self-pay

## 2020-11-07 ENCOUNTER — Ambulatory Visit (HOSPITAL_COMMUNITY): Payer: BC Managed Care – PPO | Admitting: Certified Registered"

## 2020-11-07 ENCOUNTER — Encounter (HOSPITAL_COMMUNITY)
Admission: RE | Disposition: A | Discharge status: Home / Self Care | Payer: Self-pay | Source: Home / Self Care | Attending: Surgery

## 2020-11-07 ENCOUNTER — Encounter (HOSPITAL_COMMUNITY): Payer: Self-pay | Admitting: Surgery

## 2020-11-07 ENCOUNTER — Ambulatory Visit (HOSPITAL_COMMUNITY)
Admission: RE | Admit: 2020-11-07 | Discharge: 2020-11-07 | Disposition: A | Discharge status: Home / Self Care | Payer: BC Managed Care – PPO | Attending: Surgery | Admitting: Surgery

## 2020-11-07 DIAGNOSIS — K8010 Calculus of gallbladder with chronic cholecystitis without obstruction: Secondary | ICD-10-CM | POA: Insufficient documentation

## 2020-11-07 DIAGNOSIS — Z8249 Family history of ischemic heart disease and other diseases of the circulatory system: Secondary | ICD-10-CM | POA: Diagnosis not present

## 2020-11-07 DIAGNOSIS — Z791 Long term (current) use of non-steroidal anti-inflammatories (NSAID): Secondary | ICD-10-CM | POA: Insufficient documentation

## 2020-11-07 DIAGNOSIS — Z888 Allergy status to other drugs, medicaments and biological substances status: Secondary | ICD-10-CM | POA: Insufficient documentation

## 2020-11-07 DIAGNOSIS — Z7951 Long term (current) use of inhaled steroids: Secondary | ICD-10-CM | POA: Insufficient documentation

## 2020-11-07 DIAGNOSIS — Z880 Allergy status to penicillin: Secondary | ICD-10-CM | POA: Insufficient documentation

## 2020-11-07 DIAGNOSIS — Z8349 Family history of other endocrine, nutritional and metabolic diseases: Secondary | ICD-10-CM | POA: Insufficient documentation

## 2020-11-07 DIAGNOSIS — Z801 Family history of malignant neoplasm of trachea, bronchus and lung: Secondary | ICD-10-CM | POA: Insufficient documentation

## 2020-11-07 DIAGNOSIS — E039 Hypothyroidism, unspecified: Secondary | ICD-10-CM | POA: Diagnosis not present

## 2020-11-07 DIAGNOSIS — Z79899 Other long term (current) drug therapy: Secondary | ICD-10-CM | POA: Insufficient documentation

## 2020-11-07 HISTORY — PX: CHOLECYSTECTOMY: SHX55

## 2020-11-07 SURGERY — LAPAROSCOPIC CHOLECYSTECTOMY
Anesthesia: General

## 2020-11-07 MED ORDER — PHENYLEPHRINE 40 MCG/ML (10ML) SYRINGE FOR IV PUSH (FOR BLOOD PRESSURE SUPPORT)
PREFILLED_SYRINGE | INTRAVENOUS | Status: DC | PRN
  Administered 2020-11-07: 120 ug via INTRAVENOUS
  Administered 2020-11-07: 160 ug via INTRAVENOUS
  Administered 2020-11-07: 120 ug via INTRAVENOUS

## 2020-11-07 MED ORDER — PROPOFOL 10 MG/ML IV BOLUS
INTRAVENOUS | Status: AC
  Filled 2020-11-07: qty 20

## 2020-11-07 MED ORDER — LIDOCAINE HCL (PF) 2 % IJ SOLN
INTRAMUSCULAR | Status: DC | PRN
  Administered 2020-11-07: 100 mg via INTRADERMAL

## 2020-11-07 MED ORDER — ROCURONIUM BROMIDE 10 MG/ML (PF) SYRINGE
PREFILLED_SYRINGE | INTRAVENOUS | Status: AC
  Filled 2020-11-07: qty 10

## 2020-11-07 MED ORDER — PHENYLEPHRINE HCL-NACL 10-0.9 MG/250ML-% IV SOLN
INTRAVENOUS | Status: DC | PRN
  Administered 2020-11-07: 50 ug/min via INTRAVENOUS

## 2020-11-07 MED ORDER — MIDAZOLAM HCL 2 MG/2ML IJ SOLN
INTRAMUSCULAR | Status: AC
  Filled 2020-11-07: qty 2

## 2020-11-07 MED ORDER — FENTANYL CITRATE (PF) 250 MCG/5ML IJ SOLN
INTRAMUSCULAR | Status: AC
  Filled 2020-11-07: qty 5

## 2020-11-07 MED ORDER — KETOROLAC TROMETHAMINE 30 MG/ML IJ SOLN
INTRAMUSCULAR | Status: AC
  Filled 2020-11-07: qty 1

## 2020-11-07 MED ORDER — CHLORHEXIDINE GLUCONATE 0.12 % MT SOLN
15.0000 mL | Freq: Once | OROMUCOSAL | Status: AC
  Administered 2020-11-07: 15 mL via OROMUCOSAL
  Filled 2020-11-07: qty 15

## 2020-11-07 MED ORDER — SCOPOLAMINE 1 MG/3DAYS TD PT72
MEDICATED_PATCH | TRANSDERMAL | Status: AC
  Filled 2020-11-07: qty 1

## 2020-11-07 MED ORDER — SUGAMMADEX SODIUM 200 MG/2ML IV SOLN
INTRAVENOUS | Status: DC | PRN
  Administered 2020-11-07: 200 mg via INTRAVENOUS

## 2020-11-07 MED ORDER — ONDANSETRON HCL 4 MG/2ML IJ SOLN
INTRAMUSCULAR | Status: DC | PRN
  Administered 2020-11-07: 4 mg via INTRAVENOUS

## 2020-11-07 MED ORDER — GABAPENTIN 300 MG PO CAPS
300.0000 mg | ORAL_CAPSULE | ORAL | Status: AC
  Administered 2020-11-07: 300 mg via ORAL
  Filled 2020-11-07: qty 1

## 2020-11-07 MED ORDER — FENTANYL CITRATE (PF) 100 MCG/2ML IJ SOLN
INTRAMUSCULAR | Status: DC | PRN
  Administered 2020-11-07: 50 ug via INTRAVENOUS
  Administered 2020-11-07: 25 ug via INTRAVENOUS
  Administered 2020-11-07: 50 ug via INTRAVENOUS
  Administered 2020-11-07: 25 ug via INTRAVENOUS

## 2020-11-07 MED ORDER — PHENYLEPHRINE 40 MCG/ML (10ML) SYRINGE FOR IV PUSH (FOR BLOOD PRESSURE SUPPORT)
PREFILLED_SYRINGE | INTRAVENOUS | Status: AC
  Filled 2020-11-07: qty 10

## 2020-11-07 MED ORDER — BUPIVACAINE-EPINEPHRINE 0.25% -1:200000 IJ SOLN
INTRAMUSCULAR | Status: DC | PRN
  Administered 2020-11-07: 20 mL

## 2020-11-07 MED ORDER — DEXAMETHASONE SODIUM PHOSPHATE 10 MG/ML IJ SOLN
INTRAMUSCULAR | Status: DC | PRN
  Administered 2020-11-07: 5 mg via INTRAVENOUS

## 2020-11-07 MED ORDER — DROPERIDOL 2.5 MG/ML IJ SOLN
INTRAMUSCULAR | Status: AC
  Filled 2020-11-07: qty 2

## 2020-11-07 MED ORDER — ONDANSETRON HCL 4 MG/2ML IJ SOLN
INTRAMUSCULAR | Status: AC
  Filled 2020-11-07: qty 2

## 2020-11-07 MED ORDER — ONDANSETRON HCL 4 MG/2ML IJ SOLN: 4.0000 mg | Freq: Once | INTRAMUSCULAR | Status: DC | PRN

## 2020-11-07 MED ORDER — OXYCODONE HCL 5 MG/5ML PO SOLN: 5.0000 mg | Freq: Once | ORAL | Status: DC | PRN

## 2020-11-07 MED ORDER — LACTATED RINGERS IV SOLN: INTRAVENOUS | Status: DC

## 2020-11-07 MED ORDER — DROPERIDOL 2.5 MG/ML IJ SOLN
INTRAMUSCULAR | Status: DC | PRN
  Administered 2020-11-07: .625 mg via INTRAVENOUS

## 2020-11-07 MED ORDER — HYDROCODONE-ACETAMINOPHEN 5-325 MG PO TABS: 1.0000 | ORAL_TABLET | Freq: Four times a day (QID) | ORAL | 0 refills | Status: DC | PRN

## 2020-11-07 MED ORDER — KETOROLAC TROMETHAMINE 30 MG/ML IJ SOLN
30.0000 mg | Freq: Once | INTRAMUSCULAR | Status: AC | PRN
  Administered 2020-11-07: 30 mg via INTRAVENOUS

## 2020-11-07 MED ORDER — 0.9 % SODIUM CHLORIDE (POUR BTL) OPTIME
TOPICAL | Status: DC | PRN
  Administered 2020-11-07: 1000 mL

## 2020-11-07 MED ORDER — ROCURONIUM BROMIDE 10 MG/ML (PF) SYRINGE
PREFILLED_SYRINGE | INTRAVENOUS | Status: DC | PRN
  Administered 2020-11-07: 60 mg via INTRAVENOUS
  Administered 2020-11-07: 20 mg via INTRAVENOUS

## 2020-11-07 MED ORDER — HYDROMORPHONE HCL 1 MG/ML IJ SOLN: 0.2500 mg | INTRAMUSCULAR | Status: DC | PRN

## 2020-11-07 MED ORDER — MIDAZOLAM HCL 2 MG/2ML IJ SOLN
INTRAMUSCULAR | Status: DC | PRN
  Administered 2020-11-07: 2 mg via INTRAVENOUS

## 2020-11-07 MED ORDER — SODIUM CHLORIDE 0.9 % IR SOLN
Status: DC | PRN
  Administered 2020-11-07: 1000 mL

## 2020-11-07 MED ORDER — DEXAMETHASONE SODIUM PHOSPHATE 10 MG/ML IJ SOLN
INTRAMUSCULAR | Status: AC
  Filled 2020-11-07: qty 1

## 2020-11-07 MED ORDER — ORAL CARE MOUTH RINSE: 15.0000 mL | Freq: Once | OROMUCOSAL | Status: AC

## 2020-11-07 MED ORDER — LIDOCAINE 2% (20 MG/ML) 5 ML SYRINGE
INTRAMUSCULAR | Status: AC
  Filled 2020-11-07: qty 5

## 2020-11-07 MED ORDER — PROPOFOL 10 MG/ML IV BOLUS
INTRAVENOUS | Status: DC | PRN
  Administered 2020-11-07: 150 mg via INTRAVENOUS

## 2020-11-07 MED ORDER — OXYCODONE HCL 5 MG PO TABS: 5.0000 mg | ORAL_TABLET | Freq: Once | ORAL | Status: DC | PRN

## 2020-11-07 MED ORDER — ACETAMINOPHEN 500 MG PO TABS
1000.0000 mg | ORAL_TABLET | ORAL | Status: AC
  Administered 2020-11-07: 1000 mg via ORAL
  Filled 2020-11-07: qty 2

## 2020-11-07 MED ORDER — CEFAZOLIN SODIUM-DEXTROSE 2-4 GM/100ML-% IV SOLN
2.0000 g | INTRAVENOUS | Status: AC
  Administered 2020-11-07: 2 g via INTRAVENOUS
  Filled 2020-11-07: qty 100

## 2020-11-07 MED ORDER — PROPOFOL 500 MG/50ML IV EMUL
INTRAVENOUS | Status: DC | PRN
  Administered 2020-11-07: 150 ug/kg/min via INTRAVENOUS

## 2020-11-07 SURGICAL SUPPLY — 41 items
APPLIER CLIP 5 13 M/L LIGAMAX5 (MISCELLANEOUS) ×2
BAG COUNTER SPONGE SURGICOUNT (BAG) ×2 IMPLANT
BLADE CLIPPER SURG (BLADE) IMPLANT
CANISTER SUCT 3000ML PPV (MISCELLANEOUS) ×2 IMPLANT
CHLORAPREP W/TINT 26 (MISCELLANEOUS) ×2 IMPLANT
CLIP APPLIE 5 13 M/L LIGAMAX5 (MISCELLANEOUS) ×1 IMPLANT
COVER SURGICAL LIGHT HANDLE (MISCELLANEOUS) ×2 IMPLANT
DERMABOND ADVANCED (GAUZE/BANDAGES/DRESSINGS) ×1
DERMABOND ADVANCED .7 DNX12 (GAUZE/BANDAGES/DRESSINGS) ×1 IMPLANT
ELECT REM PT RETURN 9FT ADLT (ELECTROSURGICAL) ×2
ELECTRODE REM PT RTRN 9FT ADLT (ELECTROSURGICAL) ×1 IMPLANT
GLOVE SURG POLY MICRO LF SZ5.5 (GLOVE) ×2 IMPLANT
GLOVE SURG UNDER POLY LF SZ6 (GLOVE) ×2 IMPLANT
GOWN STRL REUS W/ TWL LRG LVL3 (GOWN DISPOSABLE) ×3 IMPLANT
GOWN STRL REUS W/TWL LRG LVL3 (GOWN DISPOSABLE) ×6
KIT BASIN OR (CUSTOM PROCEDURE TRAY) ×2 IMPLANT
KIT TURNOVER KIT B (KITS) ×2 IMPLANT
L-HOOK LAP DISP 36CM (ELECTROSURGICAL) ×2
LHOOK LAP DISP 36CM (ELECTROSURGICAL) ×1 IMPLANT
NEEDLE INSUFFLATION 14GA 120MM (NEEDLE) IMPLANT
NS IRRIG 1000ML POUR BTL (IV SOLUTION) ×2 IMPLANT
PAD ARMBOARD 7.5X6 YLW CONV (MISCELLANEOUS) ×2 IMPLANT
PENCIL BUTTON HOLSTER BLD 10FT (ELECTRODE) ×2 IMPLANT
POUCH SPECIMEN RETRIEVAL 10MM (ENDOMECHANICALS) ×2 IMPLANT
SCISSORS LAP 5X35 DISP (ENDOMECHANICALS) ×2 IMPLANT
SET IRRIG TUBING LAPAROSCOPIC (IRRIGATION / IRRIGATOR) ×2 IMPLANT
SET TUBE SMOKE EVAC HIGH FLOW (TUBING) ×2 IMPLANT
SLEEVE ENDOPATH XCEL 5M (ENDOMECHANICALS) ×4 IMPLANT
SPECIMEN JAR SMALL (MISCELLANEOUS) ×2 IMPLANT
SPONGE T-LAP 18X18 ~~LOC~~+RFID (SPONGE) ×2 IMPLANT
SUT MNCRL AB 4-0 PS2 18 (SUTURE) ×4 IMPLANT
SUT VIC AB 3-0 SH 27 (SUTURE)
SUT VIC AB 3-0 SH 27XBRD (SUTURE) IMPLANT
SUT VICRYL 0 UR6 27IN ABS (SUTURE) ×6 IMPLANT
TOWEL GREEN STERILE (TOWEL DISPOSABLE) ×2 IMPLANT
TOWEL GREEN STERILE FF (TOWEL DISPOSABLE) ×2 IMPLANT
TRAY LAPAROSCOPIC MC (CUSTOM PROCEDURE TRAY) ×2 IMPLANT
TROCAR XCEL 12X100 BLDLESS (ENDOMECHANICALS) IMPLANT
TROCAR XCEL BLUNT TIP 100MML (ENDOMECHANICALS) ×2 IMPLANT
TROCAR XCEL NON-BLD 5MMX100MML (ENDOMECHANICALS) ×2 IMPLANT
WATER STERILE IRR 1000ML POUR (IV SOLUTION) ×2 IMPLANT

## 2020-11-07 NOTE — Anesthesia Procedure Notes (Signed)
Procedure Name: Intubation Date/Time: 11/07/2020 9:55 AM Performed by: Barrington Ellison, CRNA Pre-anesthesia Checklist: Patient identified, Emergency Drugs available, Suction available and Patient being monitored Patient Re-evaluated:Patient Re-evaluated prior to induction Oxygen Delivery Method: Circle System Utilized Preoxygenation: Pre-oxygenation with 100% oxygen Induction Type: IV induction Ventilation: Mask ventilation without difficulty Laryngoscope Size: Mac and 3 Grade View: Grade II Tube type: Oral Tube size: 7.0 mm Number of attempts: 1 Airway Equipment and Method: Stylet and Oral airway Placement Confirmation: ETT inserted through vocal cords under direct vision, positive ETCO2 and breath sounds checked- equal and bilateral Secured at: 21 cm Tube secured with: Tape Dental Injury: Teeth and Oropharynx as per pre-operative assessment

## 2020-11-07 NOTE — Transfer of Care (Signed)
Immediate Anesthesia Transfer of Care Note  Patient: Daisy [name]  Procedure(s) Performed: LAPAROSCOPIC CHOLECYSTECTOMY  Patient Location: PACU  Anesthesia Type:General  Level of Consciousness: awake  Airway & Oxygen Therapy: Patient Spontanous Breathing and Patient connected to nasal cannula oxygen  Post-op Assessment: Report given to RN  Post vital signs: Reviewed and stable  Last Vitals:  Vitals Value Taken Time  BP 133/67 11/07/20 1135  Temp    Pulse 72 11/07/20 1135  Resp 20 11/07/20 1135  SpO2 98 % 11/07/20 1135  Vitals shown include unvalidated device data.  Last Pain:  Vitals:   11/07/20 0725  TempSrc:   PainSc: 0-No pain         Complications: No notable events documented.

## 2020-11-07 NOTE — Discharge Instructions (Addendum)
[facility] DISCHARGE INSTRUCTIONS  Activity No heavy lifting greater than 15 pounds for 4 weeks after surgery. Ok to shower in 24 hours after surgery, but do not bathe or submerge incisions underwater. Do not drive while taking narcotic pain medication.  Wound Care Your incisions are covered with skin glue called Dermabond. This will peel off on its own over time. You may shower in 24 hours and allow warm soapy water to run over your incisions. Gently pat dry. Do not submerge your incision underwater. Monitor your incision for any new redness, tenderness, or drainage.  When to Call us: Fever greater than 100.5 New redness, drainage, or swelling at incision site Severe pain, nausea, or vomiting Jaundice (yellowing of the whites of the eyes or skin)  Follow-up You have an appointment scheduled with Dr. Zenia Resides on November 21, 2020 at [facility]. This will be at the [facility] Surgery office at [address], [place], [place], [place]. Please arrive at least 15 minutes prior to your scheduled appointment time.  For questions or concerns, please call the office at [phone].

## 2020-11-07 NOTE — H&P (Signed)
Daisy [name] is an 65 y.o. female.   Chief Complaint: RUQ pain HPI: Daisy [name] is a 65 yo female who was referred to discuss cholecystectomy. In April she had an episode of epigastric discomfort, belching and bloating shortly after eating. The symptoms later resolved but she intermittently had upper abdominal/RUQ discomfort over the next few days. Since then she has not had any recurrent symptoms, and prior to that had not had any similar episodes. She was seen by her PCP, who referred her for a RUQ [place]. This showed a large 2cm gallstone but no signs of acute cholecystitis. She presents today for surgery.  Past Medical History:  Diagnosis Date   Anxiety    Arthritis of both knees 2021   Broken foot 2013   right   Bronchitis    Depression    Family history of adverse reaction to anesthesia    mother with PONV after gallbladder removal   Hypothyroidism    OCD (obsessive compulsive disorder)    PONV (postoperative nausea and vomiting)    after D&C   Retinal tear, bilateral    03/2016    Thyroid disease 1994   hypothyroid--Dr. Darnelle Going   Vitamin D deficiency     Past Surgical History:  Procedure Laterality Date   DILATATION & CURRETTAGE/HYSTEROSCOPY WITH RESECTOCOPE N/A 10/18/2012   Procedure: DILATATION & CURETTAGE/HYSTEROSCOPY WITH RESECTOCOPE;  Surgeon: Arloa Koh, MD;  Location: [facility];  Service: Gynecology;  Laterality: N/A;   DILATION AND CURETTAGE OF UTERUS     EYE SURGERY     KNEE ARTHROSCOPY W/ MENISCAL REPAIR Left 10/2019   LAPAROSCOPY Left 10/18/2012   Procedure: LEFT S&O;  Surgeon: Arloa Koh, MD;  Location: [facility];  Service: Gynecology;  Laterality: Left;  PELVIC WASHING   RETINAL TEAR REPAIR CRYOTHERAPY Bilateral    03/2016   SALPINGOOPHORECTOMY Bilateral 10/18/2012   Procedure: SALPINGO OOPHORECTOMY;  Surgeon: Arloa Koh, MD;  Location: [facility];  Service: Gynecology;  Laterality: Bilateral;   TONSILLECTOMY     age 43yrs   TONSILLECTOMY AND  ADENOIDECTOMY     age 41    Family History  Problem Relation Age of Onset   Osteoarthritis Mother    Thyroid disease Mother    Other Mother        fractured disc   Lung cancer Father        mets to liver/dec'd age 51   Hypertension Father    Thyroid disease Maternal Grandmother    Social History:  reports that she has never smoked. She has never used smokeless tobacco. She reports that she does not drink alcohol and does not use drugs.  Allergies:  Allergies  Allergen Reactions   Diclofenac Other (See Comments)    Gi -upset   Penicillins Other (See Comments)    Found out on allergy testing Reaction: over 20 years ago   Sudafed [Pseudoephedrine Hcl] Other (See Comments)    Feels wired and like has had too much coffee   Thimerosal Rash    Medications Prior to Admission  Medication Sig Dispense Refill   Cholecalciferol (VITAMIN D3) 2000 units TABS Take 2,000 Units by mouth daily.     doxepin (SINEQUAN) 50 MG capsule Take 50 mg by mouth at bedtime.     ibuprofen (ADVIL,MOTRIN) 800 MG tablet Take 1 tablet (800 mg total) by mouth every 8 (eight) hours as needed for pain. (Patient taking differently: Take 400 mg by mouth every 8 (eight) hours as needed for  pain.) 30 tablet 0   LORazepam (ATIVAN) 0.5 MG tablet Take 0.5 mg by mouth daily as needed for anxiety.     melatonin 5 MG TABS Take 5 mg by mouth at bedtime.     Multiple Vitamins-Minerals (MULTIVITAMIN PO) Take 1 tablet by mouth daily.     SYNTHROID 112 MCG tablet Take 112 mcg by mouth daily before breakfast.     fexofenadine (ALLEGRA) 60 MG tablet Take 60 mg by mouth daily as needed for allergies.     fluticasone (FLONASE) 50 MCG/ACT nasal spray Place 1 spray into both nostrils daily as needed for allergies.  1   Fluticasone-Salmeterol (ADVAIR) 250-50 MCG/DOSE AEPB Inhale 1 puff into the lungs daily as needed (Shortness of breath when sick and bronchitis).     nystatin (MYCOSTATIN/NYSTOP) powder Apply topically 3 (three) times  daily. Apply to affected area for up to 7 days (Patient not taking: Reported on 10/29/2020) 30 g 2   VENTOLIN HFA 108 (90 BASE) MCG/ACT inhaler Inhale 2 puffs into the lungs every 6 (six) hours as needed (only when sick with  bronchiti).  0   vitamin C (ASCORBIC ACID) 500 MG tablet Take 500 mg by mouth daily.      No results found for this or any previous visit (from the past 48 hour(s)). No results found.  Review of Systems  Blood pressure (!) 180/84, pulse 82, temperature 98.7 F (37.1 C), temperature source Oral, resp. rate 18, height 5' 3.5" (1.613 m), weight 85.2 kg, last menstrual period 04/26/2010, SpO2 96 %. Physical Exam Constitutional:      General: She is not in acute distress.    Appearance: Normal appearance.  HENT:     Head: Normocephalic and atraumatic.  Eyes:     General: No scleral icterus.    Conjunctiva/sclera: Conjunctivae normal.  Pulmonary:     Effort: Pulmonary effort is normal. No respiratory distress.  Abdominal:     General: There is no distension.     Palpations: Abdomen is soft.     Tenderness: There is no abdominal tenderness.  Musculoskeletal:        General: Normal range of motion.     Cervical back: Normal range of motion.  Skin:    General: Skin is warm and dry.     Coloration: Skin is not jaundiced.  Neurological:     General: No focal deficit present.     Mental Status: She is alert and oriented to person, place, and time.  Psychiatric:        Mood and Affect: Mood normal.        Behavior: Behavior normal.        Thought Content: Thought content normal.     Assessment/Plan 65 yo female with symptomatic cholelithiasis. Proceed to OR for laparoscopic cholecystectomy. Informed consent completed. Plan for discharge home postoperatively.  Dwan Bolt, MD 11/07/2020, 9:22 AM

## 2020-11-07 NOTE — Anesthesia Postprocedure Evaluation (Signed)
Anesthesia Post Note  Patient: Daisy [name]  Procedure(s) Performed: [place]     Patient location during evaluation: PACU Anesthesia Type: General Level of consciousness: awake and alert Pain management: pain level controlled Vital Signs Assessment: post-procedure vital signs reviewed and stable Respiratory status: spontaneous breathing, nonlabored ventilation, respiratory function stable and patient connected to nasal cannula oxygen Cardiovascular status: blood pressure returned to baseline and stable Postop Assessment: no apparent nausea or vomiting Anesthetic complications: no   No notable events documented.  Last Vitals:  Vitals:   11/07/20 1235 11/07/20 1249  BP: (!) 141/73 (!) 142/81  Pulse: 80 79  Resp: 16 17  Temp:  (P) 36.8 C  SpO2: 96% 97%    Last Pain:  Vitals:   11/07/20 0725  TempSrc:   PainSc: 0-No pain                 Mayce Noyes S

## 2020-11-07 NOTE — Op Note (Signed)
Date: 11/07/20  Patient: Daisy [name] MRN: [id number]  Preoperative Diagnosis: Symptomatic cholelithiasis Postoperative Diagnosis: Same  Procedure: Laparoscopic cholecystectomy  Surgeon: Michaelle Birks, MD Assistant: Lucas Mallow, MD (Resident)  EBL: Minimal  Anesthesia: General endotracheal  Specimens: Gallbladder  Indications: [name] is a 65 yo female who presented with RUQ abdominal pain and was found to have cholelithiasis. After a discussion of the risks and benefits of surgery, she elected to proceed with cholecystectomy.  Findings: Large gallstone. No evidence of acute cholecystitis.  Procedure details: Informed consent was obtained in the preoperative area prior to the procedure. The patient was brought to the operating room and placed on the table in the supine position. General anesthesia was induced and appropriate lines and drains were placed for intraoperative monitoring. Perioperative antibiotics were administered per SCIP guidelines. The abdomen was prepped and draped in the usual sterile fashion. A pre-procedure timeout was taken verifying patient identity, surgical site and procedure to be performed.  A small infraumbilical skin incision was made, the subcutaneous tissue was divided with cautery, and the umbilical stalk was grasped and elevated. The fascia was incised and the peritoneal cavity was directly visualized. A 44mm Hassan trocar was placed. The peritoneal cavity was inspected with no evidence of visceral or vascular injury. Three 56mm ports were placed in the right subcostal margin, all under direct visualization. The fundus of the gallbladder was grasped and retracted cephalad. The infundibulum was retracted laterally. The cystic triangle was dissected out using cautery and blunt dissection, and the critical view of safety was obtained. The cystic duct and cystic artery were clipped and ligated. The gallbladder was taken off the liver using cautery. The  specimen was placed in an endocatch bag. The surgical site was irrigated with saline until the effluent was clear. Hemostasis was achieved in the gallbladder fossa using cautery. The cystic duct and artery stumps were visually inspected and there was no evidence of bile leak or bleeding. The ports were removed under direct visualization and the abdomen was desufflated. The gallbladder was removed via the umbilical port site, which necessitated extending the fascial incision due to a large stone within the gallbladder. There was some spillage of bile during removal of the gallbladder, and the wound was thoroughly irrigated with betadine and saline. The umbilical port site fascia was closed with a 0 vicryl suture. The skin at all port sites was closed with 4-0 monocryl subcuticular suture. Dermabond was applied.  The patient tolerated the procedure with no apparent complications. All counts were correct x2 at the end of the procedure. The patient was extubated and taken to PACU in stable condition.  Michaelle Birks, MD 11/07/20 11:32 AM

## 2020-11-08 ENCOUNTER — Encounter (HOSPITAL_COMMUNITY): Payer: Self-pay | Admitting: Surgery

## 2020-11-10 LAB — SURGICAL PATHOLOGY

## 2020-11-17 ENCOUNTER — Encounter: Payer: Self-pay | Admitting: Obstetrics and Gynecology

## 2020-11-19 ENCOUNTER — Telehealth: Payer: Self-pay

## 2020-11-19 NOTE — Telephone Encounter (Signed)
Patient said she called yesterday for appt and sooner Dr. Quincy Simmonds can see her is 12/01/20.  Patient had lap cholecystectomy on 11/07/20 and since then with terrible hot flashes.  She said sometimes every 15 minutes but at least every hour. She said she "needs help".  She weaned off PremPro in 2017.   She even asked about another provider seeing her sooner next week.  Any suggestions?

## 2020-11-19 NOTE — Telephone Encounter (Signed)
Dr Quincy Simmonds typically tries to see her own patients. You can check with Estill Bamberg to see if she feels she can squeeze this patient in somewhere. Otherwise, it looks like Jonelle Sidle has openings on Monday and Tuesday of next week.  CC: Marisa Sprinkles, CMA

## 2020-11-20 ENCOUNTER — Encounter: Payer: Self-pay | Admitting: Nurse Practitioner

## 2020-11-20 ENCOUNTER — Other Ambulatory Visit: Payer: Self-pay

## 2020-11-20 ENCOUNTER — Ambulatory Visit: Payer: BC Managed Care – PPO | Admitting: Nurse Practitioner

## 2020-11-20 VITALS — BP 122/78

## 2020-11-20 DIAGNOSIS — R232 Flushing: Secondary | ICD-10-CM | POA: Diagnosis not present

## 2020-11-20 MED ORDER — DOXEPIN HCL 25 MG PO CAPS: 25.0000 mg | ORAL_CAPSULE | Freq: Every day | ORAL | 0 refills | Status: DC

## 2020-11-20 NOTE — Telephone Encounter (Signed)
Has appt.11-20-20 with [name].

## 2020-11-20 NOTE — Progress Notes (Signed)
   Acute Office Visit  Subjective:    Patient ID: Daisy [name], female    DOB: [birth year], 65 y.o.   MRN: [id number]   HPI 65 y.o. presents today for hot flashes. Had cholecystectomy 11/07/2020 and is now having frequent hot flashes, sometimes multiple times per hour. She was on PremPro HRT until 2017. She was still having some hot flashes in the evening prior to surgery but they were tolerable. She has taken Doxepin for years for anxiety and hot flashes. She has spoken to her surgeon about her symptoms and was told it is an uncommon symptom, no recommendations provided.    Review of Systems  Constitutional: Negative.   Endocrine: Positive for heat intolerance.      Objective:    Physical Exam Constitutional:      Appearance: Normal appearance.    BP 122/78   LMP 04/26/2010  Wt Readings from Last 3 Encounters:  11/07/20 187 lb 13.3 oz (85.2 kg)  11/03/20 187 lb 12.8 oz (85.2 kg)  08/13/20 183 lb (83 kg)        Assessment & Plan:   Problem List Items Addressed This Visit   None Visit Diagnoses     Hot flashes    -  Primary   Relevant Medications   doxepin (SINEQUAN) 25 MG capsule      Plan: We discussed the role that gallbladder removal can plan on hormone regulation as well as stress from the surgery itself. It is not recommended that she restart HRT at this time. Discussed the option to increase Doxepin temporarily for management and she is agreeable. She currently takes 50 mg and will increase to 75 mg nightly. She has an appointment with Dr. Quincy Simmonds 8/8 and will discuss symptoms at that time. She is agreeable to plan.      Tamela Gammon DNP, 4:23 PM 11/20/2020

## 2020-11-20 NOTE — Telephone Encounter (Signed)
I consulted with Daisy [name] and she saw no openings for Dr. Quincy Simmonds Monday or Tuesday.  [name] has opening at 4pm today and I offered that to the patient and she was so happy.  Appt scheduled.  She wants to leave the 8/8 appt she has scheduled with Dr. Quincy Simmonds for now and will cancel in plenty of time if sx improve after seeing TW.

## 2020-12-01 ENCOUNTER — Ambulatory Visit: Payer: BC Managed Care – PPO | Admitting: Obstetrics and Gynecology

## 2020-12-01 DIAGNOSIS — Z0289 Encounter for other administrative examinations: Secondary | ICD-10-CM

## 2020-12-01 NOTE — Progress Notes (Deleted)
GYNECOLOGY  VISIT   HPI: 65 y.o.   Single  Caucasian  female   G0P0 with Patient's last menstrual period was 04/26/2010.   here for     GYNECOLOGIC HISTORY: Patient's last menstrual period was 04/26/2010. Contraception:  PMP Menopausal hormone therapy:***?HRT Last mammogram: 03-26-19 3D/Neg/Birads1 Last pap smear:  06-30-16 Neg:Neg HR HPV, 04-01-14 Neg        OB History     Gravida  0   Para      Term      Preterm      AB      Living         SAB      IAB      Ectopic      Multiple      Live Births                 There are no problems to display for this patient.   Past Medical History:  Diagnosis Date   Anxiety    Arthritis of both knees 2021   Broken foot 2013   right   Bronchitis    Depression    Family history of adverse reaction to anesthesia    mother with PONV after gallbladder removal   Hypothyroidism    OCD (obsessive compulsive disorder)    PONV (postoperative nausea and vomiting)    after D&C   Retinal tear, bilateral    03/2016    Thyroid disease 1994   hypothyroid--Dr. Darnelle Going   Vitamin D deficiency     Past Surgical History:  Procedure Laterality Date   CHOLECYSTECTOMY N/A 11/07/2020   Procedure: LAPAROSCOPIC CHOLECYSTECTOMY;  Surgeon: Dwan Bolt, MD;  Location: [facility];  Service: General;  Laterality: N/A;   [place] N/A 10/18/2012   Procedure: DILATATION & CURETTAGE/HYSTEROSCOPY WITH RESECTOCOPE;  Surgeon: Arloa Koh, MD;  Location: [facility];  Service: Gynecology;  Laterality: N/A;   DILATION AND CURETTAGE OF UTERUS     EYE SURGERY     KNEE ARTHROSCOPY W/ MENISCAL REPAIR Left 10/2019   LAPAROSCOPY Left 10/18/2012   Procedure: LEFT S&O;  Surgeon: Arloa Koh, MD;  Location: [facility];  Service: Gynecology;  Laterality: Left;  PELVIC WASHING   RETINAL TEAR REPAIR CRYOTHERAPY Bilateral    03/2016   SALPINGOOPHORECTOMY Bilateral 10/18/2012   Procedure: SALPINGO  OOPHORECTOMY;  Surgeon: Arloa Koh, MD;  Location: [facility];  Service: Gynecology;  Laterality: Bilateral;   TONSILLECTOMY     age 39yr   TONSILLECTOMY AND ADENOIDECTOMY     age 21    Current Outpatient Medications  Medication Sig Dispense Refill   Cholecalciferol (VITAMIN D3) 2000 units TABS Take 2,000 Units by mouth daily.     doxepin (SINEQUAN) 25 MG capsule Take 1 capsule (25 mg total) by mouth at bedtime. Take with 50 mg tablet for total of 75 mg daily 30 capsule 0   doxepin (SINEQUAN) 50 MG capsule Take 50 mg by mouth at bedtime.     fexofenadine (ALLEGRA) 60 MG tablet Take 60 mg by mouth daily as needed for allergies.     fluticasone (FLONASE) 50 MCG/ACT nasal spray Place 1 spray into both nostrils daily as needed for allergies.  1   Fluticasone-Salmeterol (ADVAIR) 250-50 MCG/DOSE AEPB Inhale 1 puff into the lungs daily as needed (Shortness of breath when sick and bronchitis).     ibuprofen (ADVIL,MOTRIN) 800 MG tablet Take 1 tablet (800 mg total) by mouth every  8 (eight) hours as needed for pain. (Patient taking differently: Take 400 mg by mouth every 8 (eight) hours as needed for pain.) 30 tablet 0   LORazepam (ATIVAN) 0.5 MG tablet Take 0.5 mg by mouth daily as needed for anxiety.     melatonin 5 MG TABS Take 5 mg by mouth at bedtime.     Multiple Vitamins-Minerals (MULTIVITAMIN PO) Take 1 tablet by mouth daily.     nystatin (MYCOSTATIN/NYSTOP) powder Apply topically 3 (three) times daily. Apply to affected area for up to 7 days 30 g 2   SYNTHROID 112 MCG tablet Take 112 mcg by mouth daily before breakfast.     VENTOLIN HFA 108 (90 BASE) MCG/ACT inhaler Inhale 2 puffs into the lungs every 6 (six) hours as needed (only when sick with  bronchiti).  0   vitamin C (ASCORBIC ACID) 500 MG tablet Take 500 mg by mouth daily.     No current facility-administered medications for this visit.     ALLERGIES: Diclofenac, Penicillins, Sudafed [pseudoephedrine hcl], and Thimerosal  Family  History  Problem Relation Age of Onset   Osteoarthritis Mother    Thyroid disease Mother    Other Mother        fractured disc   Lung cancer Father        mets to liver/dec'd age 90   Hypertension Father    Thyroid disease Maternal Grandmother     Social History   Socioeconomic History   Marital status: Single    Spouse name: Not on file   Number of children: Not on file   Years of education: Not on file   Highest education level: Not on file  Occupational History   Not on file  Tobacco Use   Smoking status: Never   Smokeless tobacco: Never  Vaping Use   Vaping Use: Never used  Substance and Sexual Activity   Alcohol use: No    Alcohol/week: 0.0 standard drinks   Drug use: No   Sexual activity: Never    Birth control/protection: Abstinence, Post-menopausal    Comment: pt. is a virgin  Other Topics Concern   Not on file  Social History Narrative   Not on file   Social Determinants of Health   Financial Resource Strain: Not on file  Food Insecurity: Not on file  Transportation Needs: Not on file  Physical Activity: Not on file  Stress: Not on file  Social Connections: Not on file  Intimate Partner Violence: Not on file    Review of Systems  PHYSICAL EXAMINATION:    LMP 04/26/2010     General appearance: alert, cooperative and appears stated age Head: Normocephalic, without obvious abnormality, atraumatic Neck: no adenopathy, supple, symmetrical, trachea midline and thyroid normal to inspection and palpation Lungs: clear to auscultation bilaterally Breasts: normal appearance, no masses or tenderness, No nipple retraction or dimpling, No nipple discharge or bleeding, No axillary or supraclavicular adenopathy Heart: regular rate and rhythm Abdomen: soft, non-tender, no masses,  no organomegaly Extremities: extremities normal, atraumatic, no cyanosis or edema Skin: Skin color, texture, turgor normal. No rashes or lesions Lymph nodes: Cervical,  supraclavicular, and axillary nodes normal. No abnormal inguinal nodes palpated Neurologic: Grossly normal  Pelvic: External genitalia:  no lesions              Urethra:  normal appearing urethra with no masses, tenderness or lesions              Bartholins and Skenes: normal  Vagina: normal appearing vagina with normal color and discharge, no lesions              Cervix: no lesions                Bimanual Exam:  Uterus:  normal size, contour, position, consistency, mobility, non-tender              Adnexa: no mass, fullness, tenderness              Rectal exam: {yes no:[id number]}.  Confirms.              Anus:  normal sphincter tone, no lesions  Chaperone was present for exam:  ***  ASSESSMENT     PLAN     An After Visit Summary was printed and given to the patient.  ______ minutes face to face time of which over 50% was spent in counseling.

## 2020-12-12 ENCOUNTER — Other Ambulatory Visit: Payer: Self-pay | Admitting: Nurse Practitioner

## 2020-12-12 DIAGNOSIS — R232 Flushing: Secondary | ICD-10-CM

## 2020-12-12 NOTE — Telephone Encounter (Signed)
RX request Doxepin 25 mg Last Annual 08/13/2020 I will route to Provider for approval

## 2020-12-16 ENCOUNTER — Other Ambulatory Visit: Payer: Self-pay | Admitting: Nurse Practitioner

## 2020-12-16 DIAGNOSIS — R232 Flushing: Secondary | ICD-10-CM

## 2020-12-16 NOTE — Telephone Encounter (Signed)
Annual exam was on 08/13/20

## 2020-12-27 ENCOUNTER — Other Ambulatory Visit: Payer: Self-pay | Admitting: Nurse Practitioner

## 2020-12-27 DIAGNOSIS — R232 Flushing: Secondary | ICD-10-CM

## 2020-12-31 NOTE — Telephone Encounter (Signed)
Pharmacy sent request for 90 days supply. Was prescribed 12/16/20 for #30 with no refills.

## 2020-12-31 NOTE — Telephone Encounter (Signed)
She is a patient of Dr. Elza Rafter. I saw her during a crisis back in July and told her she needed to follow up with Dr. Quincy Simmonds a month later to reevaluate and see if continuing higher dose is necessary.  I will decline at this time.

## 2021-08-17 NOTE — Progress Notes (Deleted)
66 y.o. G0P0 Single Caucasian female here for annual exam.    PCP:     Patient's last menstrual period was 04/26/2010.           Sexually active: {yes no:314532}  The current method of family planning is post menopausal status.    Exercising: {yes no:314532}  {types:19826} Smoker:  no  Health Maintenance: Pap:  06-30-16 Neg:Neg HR HPV, 04-01-14 Neg History of abnormal Pap:  no MMG:  ***03-26-19 Neg/BiRads1 Colonoscopy:  ***NEVER BMD: 2014  Result :Normal with orthopedic TDaP:  2016 Gardasil:   n/a HIV: no Hep C:no Screening Labs:  Hb today: ***, Urine today: ***   reports that she has never smoked. She has never used smokeless tobacco. She reports that she does not drink alcohol and does not use drugs.  Past Medical History:  Diagnosis Date   Anxiety    Arthritis of both knees 2021   Broken foot 2013   right   Bronchitis    Depression    Family history of adverse reaction to anesthesia    mother with PONV after gallbladder removal   Hypothyroidism    OCD (obsessive compulsive disorder)    PONV (postoperative nausea and vomiting)    after D&C   Retinal tear, bilateral    03/2016    Thyroid disease 1994   hypothyroid--Dr. Darnelle Going   Vitamin D deficiency     Past Surgical History:  Procedure Laterality Date   CHOLECYSTECTOMY N/A 11/07/2020   Procedure: LAPAROSCOPIC CHOLECYSTECTOMY;  Surgeon: Dwan Bolt, MD;  Location: [facility];  Service: General;  Laterality: N/A;   [place] N/A 10/18/2012   Procedure: DILATATION & CURETTAGE/HYSTEROSCOPY WITH RESECTOCOPE;  Surgeon: Arloa Koh, MD;  Location: [facility];  Service: Gynecology;  Laterality: N/A;   DILATION AND CURETTAGE OF UTERUS     EYE SURGERY     KNEE ARTHROSCOPY W/ MENISCAL REPAIR Left 10/2019   LAPAROSCOPY Left 10/18/2012   Procedure: LEFT S&O;  Surgeon: Arloa Koh, MD;  Location: [facility];  Service: Gynecology;  Laterality: Left;  PELVIC WASHING   RETINAL  TEAR REPAIR CRYOTHERAPY Bilateral    03/2016   SALPINGOOPHORECTOMY Bilateral 10/18/2012   Procedure: SALPINGO OOPHORECTOMY;  Surgeon: Arloa Koh, MD;  Location: [facility];  Service: Gynecology;  Laterality: Bilateral;   TONSILLECTOMY     age 19yr   TONSILLECTOMY AND ADENOIDECTOMY     age 72    Current Outpatient Medications  Medication Sig Dispense Refill   Cholecalciferol (VITAMIN D3) 2000 units TABS Take 2,000 Units by mouth daily.     doxepin (SINEQUAN) 25 MG capsule TAKE 1 CAPSULE (25 MG TOTAL) BY MOUTH AT BEDTIME. TAKE WITH 50 MG TABLET FOR TOTAL OF 75 MG DAILY 30 capsule 0   doxepin (SINEQUAN) 50 MG capsule Take 50 mg by mouth at bedtime.     fexofenadine (ALLEGRA) 60 MG tablet Take 60 mg by mouth daily as needed for allergies.     fluticasone (FLONASE) 50 MCG/ACT nasal spray Place 1 spray into both nostrils daily as needed for allergies.  1   Fluticasone-Salmeterol (ADVAIR) 250-50 MCG/DOSE AEPB Inhale 1 puff into the lungs daily as needed (Shortness of breath when sick and bronchitis).     ibuprofen (ADVIL,MOTRIN) 800 MG tablet Take 1 tablet (800 mg total) by mouth every 8 (eight) hours as needed for pain. (Patient taking differently: Take 400 mg by mouth every 8 (eight) hours as needed for pain.) 30 tablet 0  LORazepam (ATIVAN) 0.5 MG tablet Take 0.5 mg by mouth daily as needed for anxiety.     melatonin 5 MG TABS Take 5 mg by mouth at bedtime.     Multiple Vitamins-Minerals (MULTIVITAMIN PO) Take 1 tablet by mouth daily.     nystatin (MYCOSTATIN/NYSTOP) powder Apply topically 3 (three) times daily. Apply to affected area for up to 7 days 30 g 2   SYNTHROID 112 MCG tablet Take 112 mcg by mouth daily before breakfast.     VENTOLIN HFA 108 (90 BASE) MCG/ACT inhaler Inhale 2 puffs into the lungs every 6 (six) hours as needed (only when sick with  bronchiti).  0   vitamin C (ASCORBIC ACID) 500 MG tablet Take 500 mg by mouth daily.     No current facility-administered medications for  this visit.    Family History  Problem Relation Age of Onset   Osteoarthritis Mother    Thyroid disease Mother    Other Mother        fractured disc   Lung cancer Father        mets to liver/dec'd age 6   Hypertension Father    Thyroid disease Maternal Grandmother     Review of Systems  Exam:   LMP 04/26/2010     General appearance: alert, cooperative and appears stated age Head: normocephalic, without obvious abnormality, atraumatic Neck: no adenopathy, supple, symmetrical, trachea midline and thyroid normal to inspection and palpation Lungs: clear to auscultation bilaterally Breasts: normal appearance, no masses or tenderness, No nipple retraction or dimpling, No nipple discharge or bleeding, No axillary adenopathy Heart: regular rate and rhythm Abdomen: soft, non-tender; no masses, no organomegaly Extremities: extremities normal, atraumatic, no cyanosis or edema Skin: skin color, texture, turgor normal. No rashes or lesions Lymph nodes: cervical, supraclavicular, and axillary nodes normal. Neurologic: grossly normal  Pelvic: External genitalia:  no lesions              No abnormal inguinal nodes palpated.              Urethra:  normal appearing urethra with no masses, tenderness or lesions              Bartholins and Skenes: normal                 Vagina: normal appearing vagina with normal color and discharge, no lesions              Cervix: no lesions              Pap taken: {yes no:314532} Bimanual Exam:  Uterus:  normal size, contour, position, consistency, mobility, non-tender              Adnexa: no mass, fullness, tenderness              Rectal exam: {yes no:314532}.  Confirms.              Anus:  normal sphincter tone, no lesions  Chaperone was present for exam:  ***  Assessment:   Well woman visit with gynecologic exam.   Plan: Mammogram screening discussed. Self breast awareness reviewed. Pap and HR HPV as above. Guidelines for Calcium, Vitamin D,  regular exercise program including cardiovascular and weight bearing exercise.   Follow up annually and prn.   Additional counseling given.  {yes [id number]. _______ minutes face to face time of which over 50% was spent in counseling.    After visit summary provided.

## 2021-08-19 ENCOUNTER — Ambulatory Visit: Payer: BC Managed Care – PPO | Admitting: Obstetrics and Gynecology

## 2021-10-15 ENCOUNTER — Ambulatory Visit: Payer: BC Managed Care – PPO | Admitting: Obstetrics and Gynecology

## 2021-11-27 IMAGING — US US ABDOMEN LIMITED RUQ/ASCITES
1 series · 14 of 25 positions shown · non-contrast
Comparison: None.

CLINICAL DATA: One week of right upper quadrant pain

EXAM:
ULTRASOUND ABDOMEN LIMITED RIGHT UPPER QUADRANT

[Series 1: us abdomen limited ruq/ascites · 0.22mm/px · 14 of 52 slices shown]
[im 1/52]
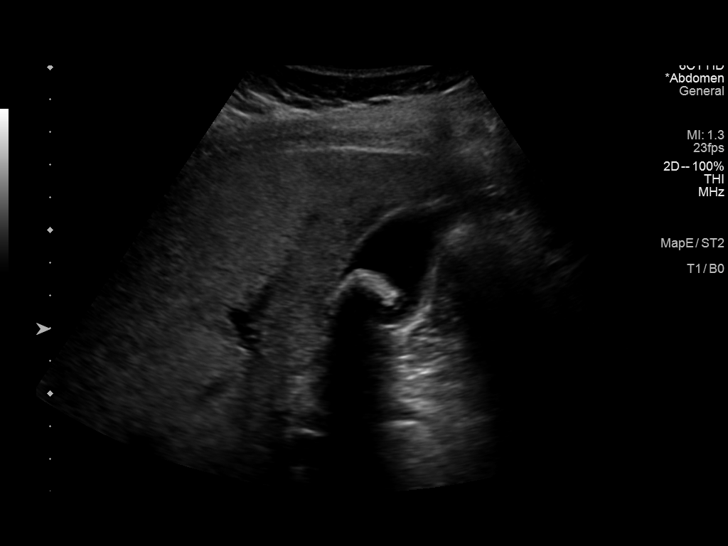
[im 5/52]
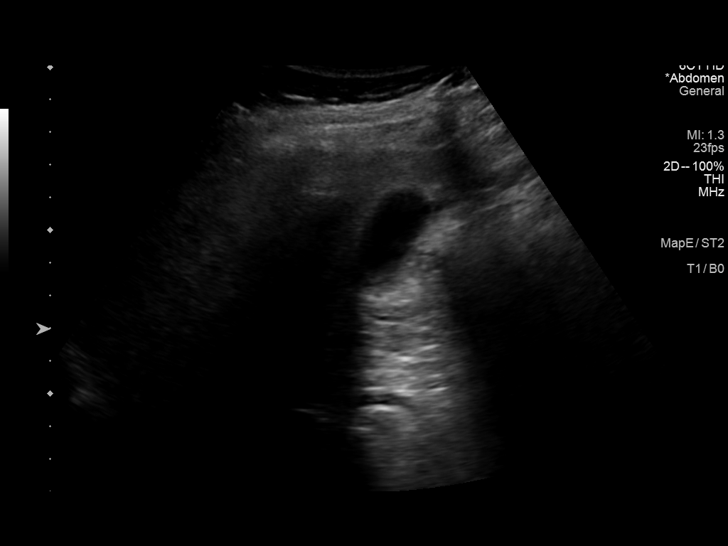
[im 9/52]
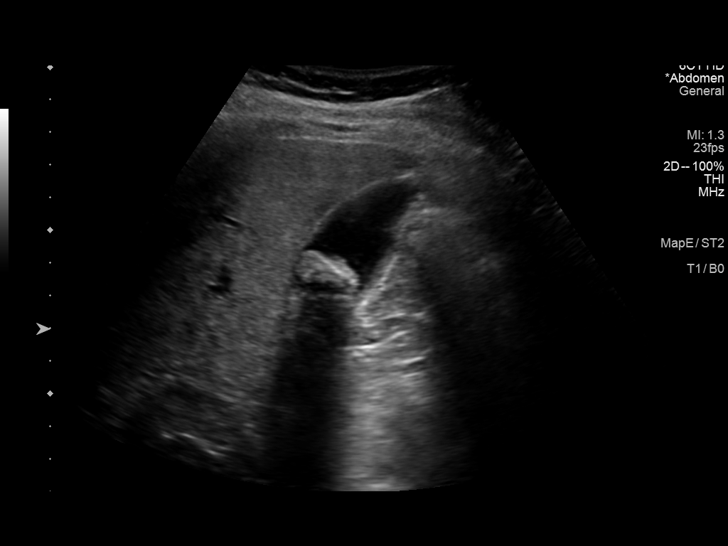
[im 13/52]
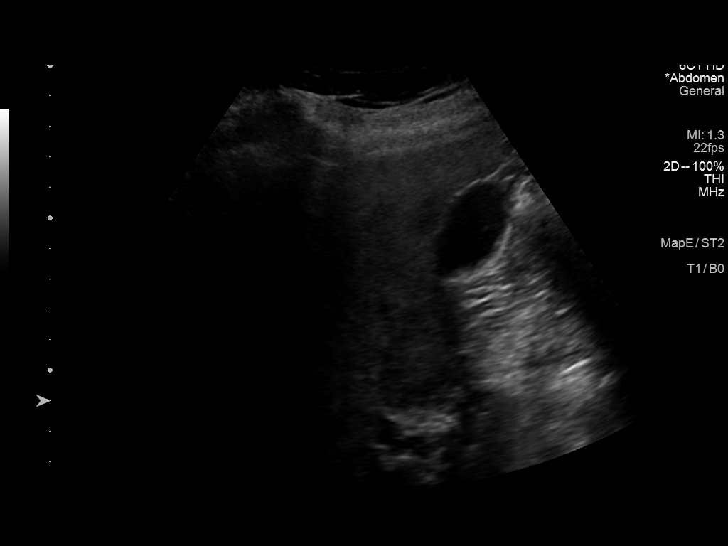
[im 18/52]
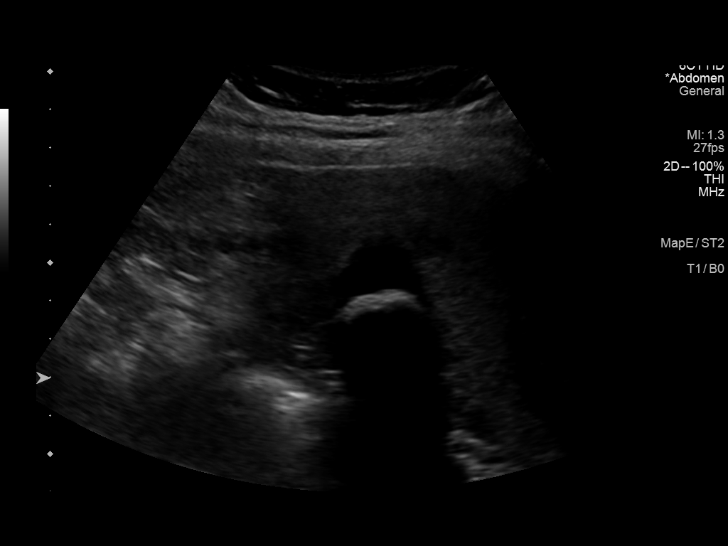
[im 20/52]
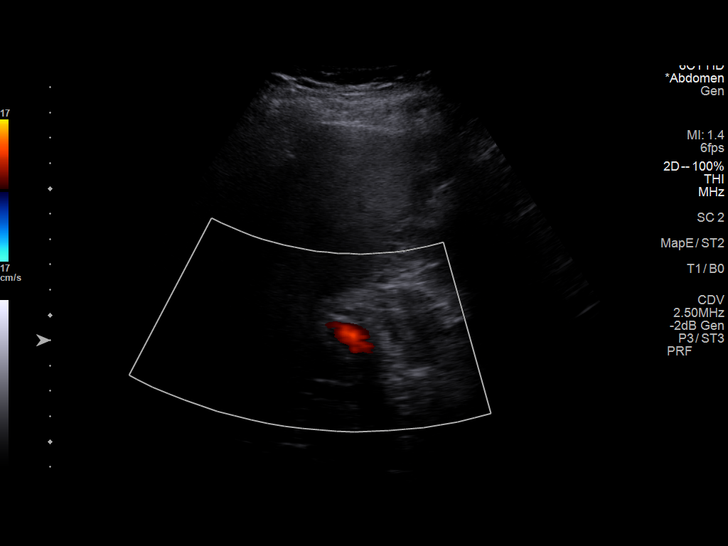
[im 24/52]
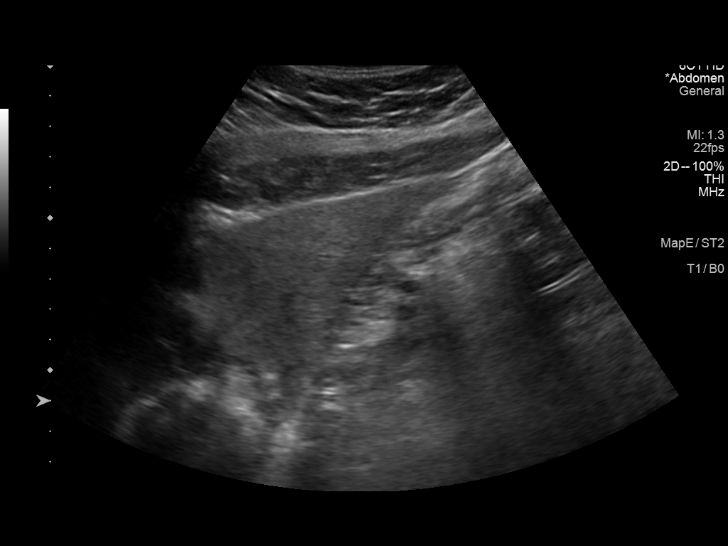
[im 28/52]
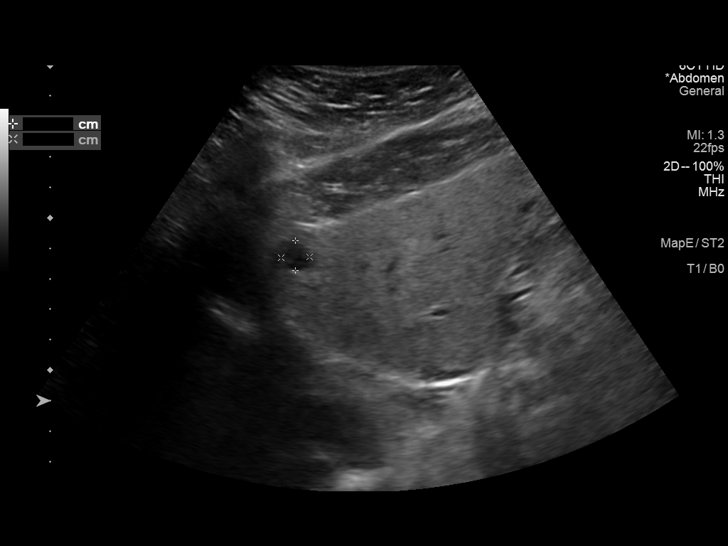
[im 32/52]
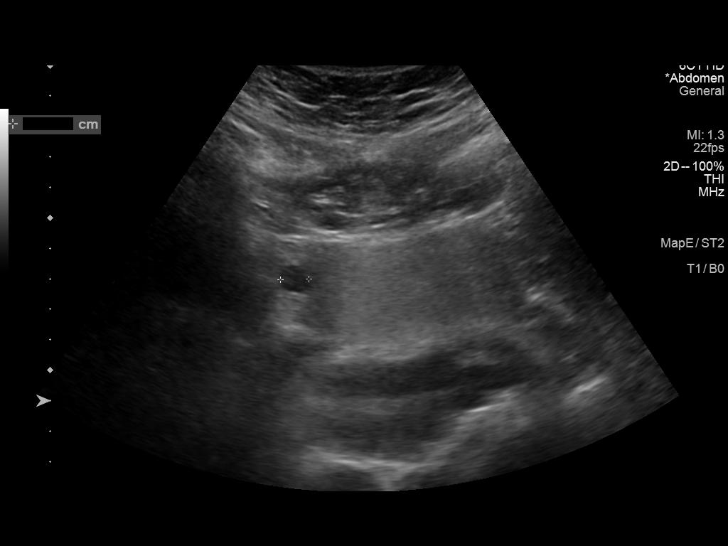
[im 35/52]
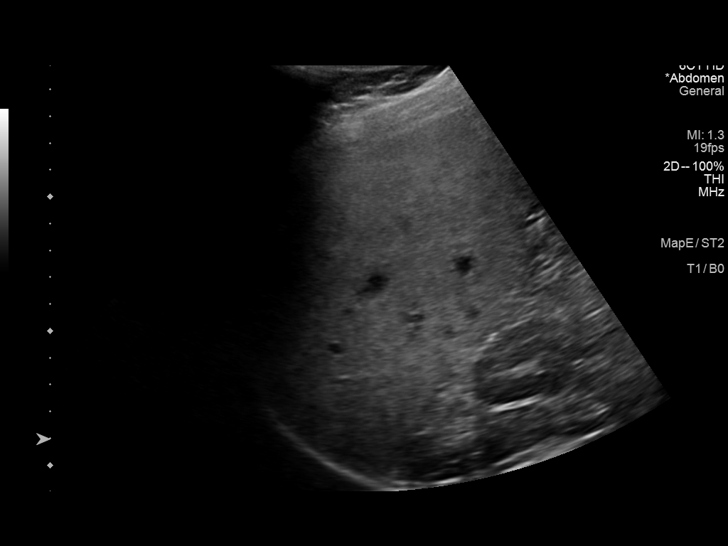
[im 39/52]
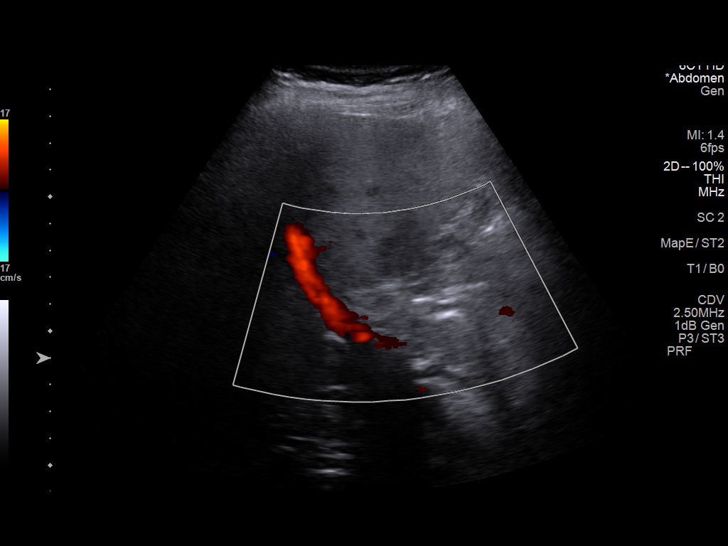
[im 43/52]
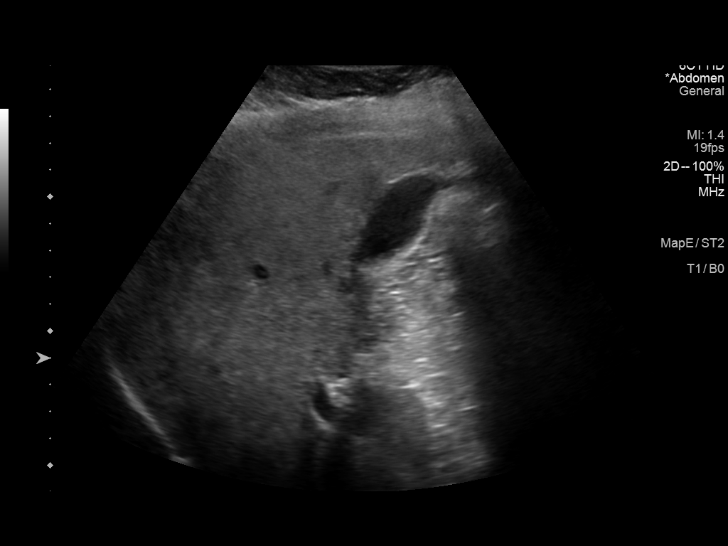
[im 47/52]
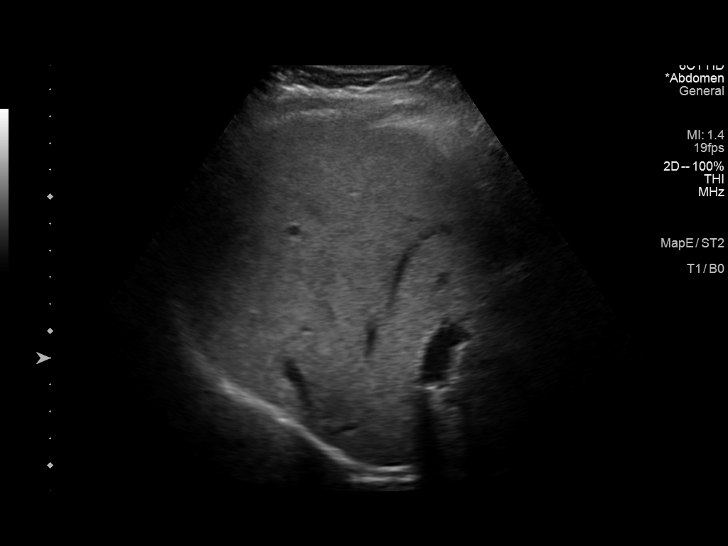
[im 52/52]
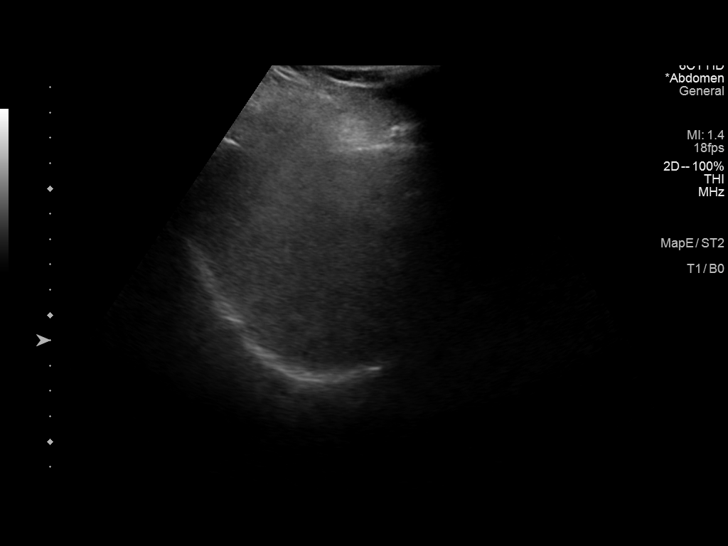

[14 of 25 positions shown; findings below may reference images not displayed]

FINDINGS: Gallbladder:

Cholelithiasis measuring 2.1 cm. No pericholecystic fluid or wall
thickening visualized. No sonographic Murphy sign noted by
sonographer.

Common bile duct:

Diameter: 5 mm

Liver:

No solid focal lesion identified. Cyst in the left lobe of the liver
measuring up to 2.3 cm. Diffusely increased parenchymal
echogenicity. Portal vein is patent on color Doppler imaging with
normal direction of blood flow towards the liver.

Other: None.
IMPRESSION: 1. Cholelithiasis without sonographic evidence of acute
cholecystitis.
2. The echogenicity of the liver is increased. This is a nonspecific
finding but is most commonly seen with fatty infiltration of the
liver. There are no obvious focal liver lesions.

## 2022-02-25 ENCOUNTER — Other Ambulatory Visit: Payer: Self-pay | Admitting: Family Medicine

## 2022-02-25 DIAGNOSIS — E2839 Other primary ovarian failure: Secondary | ICD-10-CM

## 2022-03-23 NOTE — Progress Notes (Signed)
66 y.o. G0P0 Single Caucasian female here for annual exam.    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   Joined [employer].   PCP:   Dr. Lauretta Grill  Patient's last menstrual period was 04/26/2010.           Sexually active: No.  The current method of family planning is post menopausal status.    Exercising: No.   Smoker:  no  Health Maintenance: Pap:  06/30/16 Neg:Neg HR HPV, 04/01/14 Neg  History of abnormal Pap:  no MMG: 03-26-19 Breast Density Category C, BI-RADS CATEGORY 1 Negative Colonoscopy:  n/a BMD:   2014  Result  normal w/ orthopedic TDaP:  2016 Gardasil:   no HIV: no Hep C: no Screening Labs:  PCP Flu vaccine:  completed.  Pneumonia vaccine:  completed.  RSV vaccine:  dicsussed.   reports that she has never smoked. She has never used smokeless tobacco. She reports that she does not drink alcohol and does not use drugs.  Past Medical History:  Diagnosis Date   Anxiety    Arthritis of both knees 2021   Broken foot 2013   right   Bronchitis    Depression    Family history of adverse reaction to anesthesia    mother with PONV after gallbladder removal   Hypothyroidism    OCD (obsessive compulsive disorder)    PONV (postoperative nausea and vomiting)    after D&C   Retinal tear, bilateral    03/2016    Thyroid disease 1994   hypothyroid--Dr. Darnelle Going   Vitamin D deficiency     Past Surgical History:  Procedure Laterality Date   CHOLECYSTECTOMY N/A 11/07/2020   Procedure: LAPAROSCOPIC CHOLECYSTECTOMY;  Surgeon: Dwan Bolt, MD;  Location: [facility];  Service: General;  Laterality: N/A;   [place] N/A 10/18/2012   Procedure: DILATATION & CURETTAGE/HYSTEROSCOPY WITH RESECTOCOPE;  Surgeon: Arloa Koh, MD;  Location: [facility];  Service: Gynecology;  Laterality: N/A;   DILATION AND CURETTAGE OF UTERUS     EYE SURGERY     KNEE ARTHROSCOPY W/ MENISCAL REPAIR Left 10/2019   LAPAROSCOPY Left  10/18/2012   Procedure: LEFT S&O;  Surgeon: Arloa Koh, MD;  Location: [facility];  Service: Gynecology;  Laterality: Left;  PELVIC WASHING   RETINAL TEAR REPAIR CRYOTHERAPY Bilateral    03/2016   SALPINGOOPHORECTOMY Bilateral 10/18/2012   Procedure: SALPINGO OOPHORECTOMY;  Surgeon: Arloa Koh, MD;  Location: [facility];  Service: Gynecology;  Laterality: Bilateral;   TONSILLECTOMY     age 3yr   TONSILLECTOMY AND ADENOIDECTOMY     age 31    Current Outpatient Medications  Medication Sig Dispense Refill   Cholecalciferol (VITAMIN D3) 2000 units TABS Take 2,000 Units by mouth daily.     doxepin (SINEQUAN) 50 MG capsule Take 50 mg by mouth at bedtime.     fexofenadine (ALLEGRA) 60 MG tablet Take 60 mg by mouth daily as needed for allergies.     fluticasone (FLONASE) 50 MCG/ACT nasal spray Place 1 spray into both nostrils daily as needed for allergies.  1   Fluticasone-Salmeterol (ADVAIR) 250-50 MCG/DOSE AEPB Inhale 1 puff into the lungs daily as needed (Shortness of breath when sick and bronchitis).     ibuprofen (ADVIL,MOTRIN) 800 MG tablet Take 1 tablet (800 mg total) by mouth every 8 (eight) hours as needed for pain. (Patient taking differently: Take 400 mg by mouth every 8 (eight) hours as needed for pain.)  30 tablet 0   LORazepam (ATIVAN) 0.5 MG tablet Take 1 mg by mouth daily as needed for anxiety.     melatonin 5 MG TABS Take 5 mg by mouth at bedtime.     Multiple Vitamins-Minerals (MULTIVITAMIN PO) Take 1 tablet by mouth daily.     nystatin (MYCOSTATIN/NYSTOP) powder Apply topically 3 (three) times daily. Apply to affected area for up to 7 days 30 g 2   SYNTHROID 112 MCG tablet Take 112 mcg by mouth daily before breakfast.     VENTOLIN HFA 108 (90 BASE) MCG/ACT inhaler Inhale 2 puffs into the lungs every 6 (six) hours as needed (only when sick with  bronchiti).  0   vitamin C (ASCORBIC ACID) 500 MG tablet Take 500 mg by mouth daily.     No current facility-administered medications for  this visit.    Family History  Problem Relation Age of Onset   Osteoarthritis Mother    Thyroid disease Mother    Other Mother        fractured disc   Lung cancer Father        mets to liver/dec'd age 79   Hypertension Father    Thyroid disease Maternal Grandmother     Review of Systems  All other systems reviewed and are negative.   Exam:   BP 135/80 (BP Location: Left Arm, Patient Position: Sitting, Cuff Size: Large)   Ht '5\' 4"'$  (1.626 m)   Wt 200 lb (90.7 kg)   LMP 04/26/2010   BMI 34.33 kg/m     General appearance: alert, cooperative and appears stated age Head: normocephalic, without obvious abnormality, atraumatic Neck: no adenopathy, supple, symmetrical, trachea midline and thyroid normal to inspection and palpation Lungs: clear to auscultation bilaterally Breasts: normal appearance, no masses or tenderness, No nipple retraction or dimpling, No nipple discharge or bleeding, No axillary adenopathy Heart: regular rate and rhythm Abdomen: soft, non-tender; no masses, no organomegaly Extremities: extremities normal, atraumatic, no cyanosis or edema Skin: skin color, texture, turgor normal. No rashes or lesions Lymph nodes: cervical, supraclavicular, and axillary nodes normal. Neurologic: grossly normal  Pelvic: External genitalia:  no lesions              No abnormal inguinal nodes palpated.              Urethra:  normal appearing urethra with no masses, tenderness or lesions              Bartholins and Skenes: normal                 Vagina: normal appearing vagina with normal color and discharge, no lesions              Cervix: no lesions              Pap taken: yes Bimanual Exam:  Uterus:  normal size, contour, position, consistency, mobility, non-tender              Adnexa: no mass, fullness, tenderness              Rectal exam: yes.  Confirms.              Anus:  normal sphincter tone, no lesions  Chaperone was present for exam:  Emily  Assessment:   Well  woman visit with gynecologic exam. Status post laparoscopic BSO.  HRT off.   Anxiety and OCD.  On Doxepin.  Colon cancer screening.   Plan: Mammogram screening discussed. Self  breast awareness reviewed. Pap and HR HPV as above. Guidelines for Calcium, Vitamin D, regular exercise program including cardiovascular and weight bearing exercise. Discussed colonoscopy and Cologuard.  She currently declines.  She has a BMD scheduled. Follow up annually and prn.   After visit summary provided.

## 2022-03-31 ENCOUNTER — Ambulatory Visit (INDEPENDENT_AMBULATORY_CARE_PROVIDER_SITE_OTHER): Payer: BC Managed Care – PPO | Admitting: Obstetrics and Gynecology

## 2022-03-31 ENCOUNTER — Other Ambulatory Visit (HOSPITAL_COMMUNITY)
Admission: RE | Admit: 2022-03-31 | Discharge: 2022-03-31 | Disposition: A | Discharge status: Home / Self Care | Payer: BC Managed Care – PPO | Source: Ambulatory Visit | Attending: Obstetrics and Gynecology | Admitting: Obstetrics and Gynecology

## 2022-03-31 ENCOUNTER — Encounter: Payer: Self-pay | Admitting: Obstetrics and Gynecology

## 2022-03-31 VITALS — BP 135/80 | Ht 64.0 in | Wt 200.0 lb

## 2022-03-31 DIAGNOSIS — Z01419 Encounter for gynecological examination (general) (routine) without abnormal findings: Secondary | ICD-10-CM

## 2022-03-31 DIAGNOSIS — Z124 Encounter for screening for malignant neoplasm of cervix: Secondary | ICD-10-CM | POA: Diagnosis present

## 2022-03-31 NOTE — Patient Instructions (Signed)

## 2022-04-01 LAB — CYTOLOGY - PAP
Comment: NEGATIVE
Diagnosis: NEGATIVE
High risk HPV: NEGATIVE

## 2022-08-18 ENCOUNTER — Ambulatory Visit
Admission: RE | Admit: 2022-08-18 | Discharge: 2022-08-18 | Disposition: A | Discharge status: Home / Self Care | Payer: BC Managed Care – PPO | Source: Ambulatory Visit | Attending: Family Medicine | Admitting: Family Medicine

## 2022-08-18 DIAGNOSIS — E2839 Other primary ovarian failure: Secondary | ICD-10-CM

## 2022-11-28 ENCOUNTER — Other Ambulatory Visit: Payer: Self-pay

## 2022-11-28 ENCOUNTER — Encounter (HOSPITAL_BASED_OUTPATIENT_CLINIC_OR_DEPARTMENT_OTHER): Payer: Self-pay | Admitting: Emergency Medicine

## 2022-11-28 ENCOUNTER — Emergency Department (HOSPITAL_BASED_OUTPATIENT_CLINIC_OR_DEPARTMENT_OTHER)
Admission: EM | Admit: 2022-11-28 | Discharge: 2022-11-28 | Disposition: A | Discharge status: Home / Self Care | Payer: BC Managed Care – PPO | Attending: Emergency Medicine | Admitting: Emergency Medicine

## 2022-11-28 ENCOUNTER — Emergency Department (HOSPITAL_BASED_OUTPATIENT_CLINIC_OR_DEPARTMENT_OTHER): Payer: BC Managed Care – PPO

## 2022-11-28 DIAGNOSIS — R002 Palpitations: Secondary | ICD-10-CM | POA: Diagnosis present

## 2022-11-28 DIAGNOSIS — R7989 Other specified abnormal findings of blood chemistry: Secondary | ICD-10-CM

## 2022-11-28 LAB — CBC WITH DIFFERENTIAL/PLATELET
Abs Immature Granulocytes: 0.04 10*3/uL (ref 0.00–0.07)
Basophils Absolute: 0 10*3/uL (ref 0.0–0.1)
Basophils Relative: 0 %
Eosinophils Absolute: 0.2 10*3/uL (ref 0.0–0.5)
Eosinophils Relative: 2 %
HCT: 42.1 % (ref 36.0–46.0)
Hemoglobin: 13.9 g/dL (ref 12.0–15.0)
Immature Granulocytes: 0 %
Lymphocytes Relative: 15 %
Lymphs Abs: 1.4 10*3/uL (ref 0.7–4.0)
MCH: 29.4 pg (ref 26.0–34.0)
MCHC: 33 g/dL (ref 30.0–36.0)
MCV: 89 fL (ref 80.0–100.0)
Monocytes Absolute: 0.9 10*3/uL (ref 0.1–1.0)
Monocytes Relative: 9 %
Neutro Abs: 7 10*3/uL (ref 1.7–7.7)
Neutrophils Relative %: 74 %
Platelets: 241 10*3/uL (ref 150–400)
RBC: 4.73 MIL/uL (ref 3.87–5.11)
RDW: 14.6 % (ref 11.5–15.5)
WBC: 9.6 10*3/uL (ref 4.0–10.5)
nRBC: 0 % (ref 0.0–0.2)

## 2022-11-28 LAB — TSH: TSH: 5.84 u[IU]/mL — ABNORMAL HIGH (ref 0.350–4.500)

## 2022-11-28 LAB — BASIC METABOLIC PANEL
Anion gap: 11 (ref 5–15)
BUN: 15 mg/dL (ref 8–23)
CO2: 23 mmol/L (ref 22–32)
Calcium: 10 mg/dL (ref 8.9–10.3)
Chloride: 106 mmol/L (ref 98–111)
Creatinine, Ser: 0.8 mg/dL (ref 0.44–1.00)
GFR, Estimated: >60 mL/min (ref >60)
Glucose, Bld: 114 mg/dL — ABNORMAL HIGH (ref 70–99)
Potassium: 3.8 mmol/L (ref 3.5–5.1)
Sodium: 140 mmol/L (ref 135–145)

## 2022-11-28 LAB — TROPONIN I (HIGH SENSITIVITY)
Troponin I (High Sensitivity): 7 ng/L (ref <18)
Troponin I (High Sensitivity): 8 ng/L (ref <18)

## 2022-11-28 LAB — D-DIMER, QUANTITATIVE: D-Dimer, Quant: <0.27 ug/mL-FEU (ref 0.00–0.50)

## 2022-11-28 NOTE — ED Provider Notes (Signed)
[place] EMERGENCY DEPARTMENT AT [facility] Provider Note   CSN: [id number] Arrival date & time: 11/28/22  1129     History {Add pertinent medical, surgical, social history, OB history to HPI:1} Chief Complaint  Patient presents with   Palpitations    Daisy [name] is a 67 y.o. female.   Palpitations   67 year old female presents emergency department with complaints of palpitations.  Patient states that she has been having palpitations for the past few weeks after beginning new medications in the form of Advair.  Patient states she is concerned about palpitations coming from her Advair.  Denies any chest pain, shortness of breath, abdominal pain, nausea, vomiting, fever, cough.  States that she woke up this morning around 6 AM with "feeling my heartbeat".  Patient denies feeling like her heart is beating fast but just "can feel it beat."  She also states she had some left scapular pain worsened with movement of her left shoulder that she noticed after waking up.  She states that she may have "pulled muscle in her back."  Describes pain as aching in nature without radiation.  States that 20 to 25 years ago when she was diagnosed with hypothyroidism, had similar presentation before she was put on Synthroid.  Denies any history of DVT/PE, recent surgery/immobilization, known coagulopathy, known malignancy, hormonal therapy.  Denies any change in caffeine consumption.  Past medical history significant for hypothyroidism, anxiety, bronchitis,  Home Medications Prior to Admission medications   Medication Sig Start Date End Date Taking? Authorizing Provider  Cholecalciferol (VITAMIN D3) 2000 units TABS Take 2,000 Units by mouth daily.    [provider]  doxepin (SINEQUAN) 50 MG capsule Take 50 mg by mouth at bedtime.    [provider]  fexofenadine (ALLEGRA) 60 MG tablet Take 60 mg by mouth daily as needed for allergies.    [provider]   fluticasone (FLONASE) 50 MCG/ACT nasal spray Place 1 spray into both nostrils daily as needed for allergies. 04/01/14   [provider]  Fluticasone-Salmeterol (ADVAIR) 250-50 MCG/DOSE AEPB Inhale 1 puff into the lungs daily as needed (Shortness of breath when sick and bronchitis).    [provider]  ibuprofen (ADVIL,MOTRIN) 800 MG tablet Take 1 tablet (800 mg total) by mouth every 8 (eight) hours as needed for pain. Patient taking differently: Take 400 mg by mouth every 8 (eight) hours as needed for pain. 10/18/12   Patton Salles, MD  LORazepam (ATIVAN) 0.5 MG tablet Take 1 mg by mouth daily as needed for anxiety. 05/15/20   [provider]  melatonin 5 MG TABS Take 5 mg by mouth at bedtime.    [provider]  Multiple Vitamins-Minerals (MULTIVITAMIN PO) Take 1 tablet by mouth daily.    [provider]  nystatin (MYCOSTATIN/NYSTOP) powder Apply topically 3 (three) times daily. Apply to affected area for up to 7 days 03/28/19   Patton Salles, MD  SYNTHROID 112 MCG tablet Take 112 mcg by mouth daily before breakfast. 03/28/19   [provider]  VENTOLIN HFA 108 (90 BASE) MCG/ACT inhaler Inhale 2 puffs into the lungs every 6 (six) hours as needed (only when sick with  bronchiti). 04/01/14   [provider]  vitamin C (ASCORBIC ACID) 500 MG tablet Take 500 mg by mouth daily.    [provider]      Allergies    Diclofenac, Penicillins, Sudafed [pseudoephedrine hcl], and Thimerosal (thiomersal)  Review of Systems   Review of Systems  Cardiovascular:  Positive for palpitations.  All other systems reviewed and are negative.   Physical Exam Updated Vital Signs BP (!) 169/81   Pulse 96   Temp 98.5 F (36.9 C) (Oral)   Resp 18   LMP 04/26/2010   SpO2 98%  Physical Exam Vitals and nursing note reviewed.  Constitutional:      General: She is not in acute distress.    Appearance: She is  well-developed.  HENT:     Head: Normocephalic and atraumatic.  Eyes:     Conjunctiva/sclera: Conjunctivae normal.  Cardiovascular:     Rate and Rhythm: Normal rate and regular rhythm.     Heart sounds: No murmur heard. Pulmonary:     Effort: Pulmonary effort is normal. No respiratory distress.     Breath sounds: Normal breath sounds. No wheezing, rhonchi or rales.  Abdominal:     Palpations: Abdomen is soft.     Tenderness: There is no abdominal tenderness.  Musculoskeletal:        General: No swelling.     Cervical back: Neck supple.     Right lower leg: No edema.     Left lower leg: No edema.  Skin:    General: Skin is warm and dry.     Capillary Refill: Capillary refill takes less than 2 seconds.  Neurological:     Mental Status: She is alert.  Psychiatric:        Mood and Affect: Mood normal.     ED Results / Procedures / Treatments   Labs (all labs ordered are listed, but only abnormal results are displayed) Labs Reviewed  CBC WITH DIFFERENTIAL/PLATELET  BASIC METABOLIC PANEL  TSH  D-DIMER, QUANTITATIVE  TROPONIN I (HIGH SENSITIVITY)    EKG None  Radiology No results found.  Procedures Procedures  {Document cardiac monitor, telemetry assessment procedure when appropriate:1}  Medications Ordered in ED Medications - No data to display  ED Course/ Medical Decision Making/ A&P Clinical Course as of 11/28/22 1306  Sun Nov 28, 2022  1253 Pcp 7/2 [CR]    Clinical Course User Index [CR] Peter Garter, PA   {   Click here for ABCD2, HEART and other calculatorsREFRESH Note before signing :1}                              Medical Decision Making Amount and/or Complexity of Data Reviewed Labs: ordered. Radiology: ordered.   This patient presents to the ED for concern of palpitations, this involves an extensive number of treatment options, and is a complaint that carries with it a high risk of complications and morbidity.  The differential diagnosis  includes ACS, PE, arrhythmia, medication side effect/withdrawal, caffeine consumption, thyroid issue   Co morbidities that complicate the patient evaluation  See HPI   Additional history obtained:  Additional history obtained from EMR External records from outside source obtained and reviewed including hospital records   Lab Tests:  I Ordered, and personally interpreted labs.  The pertinent results include: No leukocytosis.  No evidence of anemia.  Placed within range.  No electrolyte abnormalities.  No renal dysfunction.  D-dimer negative.  Initial troponin of 7 with repeat 8.  TSH elevated at 5.84.   Imaging Studies ordered:  I ordered imaging studies including chest x-ray I independently visualized and interpreted imaging which showed no acute cardiopulmonary abnormality I agree with the radiologist interpretation  Cardiac Monitoring: / EKG:  The patient was maintained on a cardiac monitor.  I personally viewed and interpreted the cardiac monitored which showed an underlying rhythm of: Normal sinus rhythm.  Nonspecific ST changes.   Consultations Obtained:  N/a   Problem List / ED Course / Critical interventions / Medication management  Palpitations Reevaluation of the patient showed that the patient stayed the same I have reviewed the patients home medicines and have made adjustments as needed   Social Determinants of Health:  Denies tobacco, illicit drug use   Test / Admission - Considered:  Palpitations Vitals signs significant for hypertension with blood pressure 152/88. Otherwise within normal range and stable throughout visit. Laboratory/imaging studies significant for: See above *** Worrisome signs and symptoms were discussed with the patient, and the patient acknowledged understanding to return to the ED if noticed. Patient was stable upon discharge.    {Document critical care time when appropriate:1} {Document review of labs and clinical decision  tools ie heart score, Chads2Vasc2 etc:1}  {Document your independent review of radiology images, and any outside records:1} {Document your discussion with family members, caretakers, and with consultants:1} {Document social determinants of health affecting pt's care:1} {Document your decision making why or why not admission, treatments were needed:1} Final Clinical Impression(s) / ED Diagnoses Final diagnoses:  None    Rx / DC Orders ED Discharge Orders     None

## 2022-11-28 NOTE — Discharge Instructions (Addendum)
As discussed, workup today overall reassuring.  Does not look like you are having a heart attack as EKG is reassuring along with negative troponins.  D-dimer is negative so low suspicion for blood clot in the lung.  Your TSH was elevated at 5.84; recommend follow-up with your endocrinologist for evaluation of your symptoms.  You may also benefit from cardiac monitoring to see if you are going into an abnormal rhythm.  Therefore will provide follow-up information with cardiology.  Please do not hesitate to return to emergency department if the worrisome signs and symptoms we discussed to become apparent.

## 2022-11-28 NOTE — ED Triage Notes (Signed)
Pt presents to ED POV. Pt c/o palpitations x2w. Pt reports that this morning the palpations lasted for 3 hours. Pt also c/o bak pain that is under L shoulder. Non radiating, 3/10. Pt sates normally resolved with xanax but not this time. Pt reports recent pneumonia

## 2022-11-28 NOTE — ED Notes (Signed)
Dc instructions reviewed with patient. Patient voiced understanding. Dc with belongings.  °

## 2023-02-25 ENCOUNTER — Ambulatory Visit: Payer: BC Managed Care – PPO | Attending: Cardiovascular Disease | Admitting: Cardiovascular Disease

## 2023-02-25 ENCOUNTER — Encounter: Payer: Self-pay | Admitting: Cardiovascular Disease

## 2023-02-25 VITALS — BP 166/90 | HR 88 | Ht 64.0 in | Wt 201.8 lb

## 2023-02-25 DIAGNOSIS — E669 Obesity, unspecified: Secondary | ICD-10-CM

## 2023-02-25 DIAGNOSIS — R002 Palpitations: Secondary | ICD-10-CM

## 2023-02-25 DIAGNOSIS — R0602 Shortness of breath: Secondary | ICD-10-CM

## 2023-02-25 NOTE — Patient Instructions (Addendum)
Medication Instructions:   NO CHANGES  *If you need a refill on your cardiac medications before your next appointment, please call your pharmacy*   Lab Work:  NONE   If you have labs (blood work) drawn today and your tests are completely normal, you will receive your results only by: MyChart Message (if you have MyChart) OR A paper copy in the mail If you have any lab test that is abnormal or we need to change your treatment, we will call you to review the results.   Testing/Procedures: Echo will be scheduled at [address].  Your physician has requested that you have an echocardiogram. Echocardiography is a painless test that uses sound waves to create images of your heart. It provides your doctor with information about the size and shape of your heart and how well your heart's chambers and valves are working. This procedure takes approximately one hour. There are no restrictions for this procedure. Please do NOT wear cologne, perfume, aftershave, or lotions (deodorant is allowed). Please arrive 15 minutes prior to your appointment time.   Dr. Scharlene Gloss has ordered a CT coronary calcium score.   Test locations:  [facility]  [facility] at [facility]  This is $99 out of pocket.   Coronary CalciumScan A coronary calcium scan is an imaging test used to look for deposits of calcium and other fatty materials (plaques) in the inner lining of the blood vessels of the heart (coronary arteries). These deposits of calcium and plaques can partly clog and narrow the coronary arteries without producing any symptoms or warning signs. This puts a person at risk for a heart attack. This test can detect these deposits before symptoms develop. Tell a health care provider about: Any allergies you have. All medicines you are taking, including vitamins, herbs, eye drops, creams, and over-the-counter  medicines. Any problems you or family members have had with anesthetic medicines. Any blood disorders you have. Any surgeries you have had. Any medical conditions you have. Whether you are pregnant or may be pregnant. What are the risks? Generally, this is a safe procedure. However, problems may occur, including: Harm to a pregnant woman and her unborn baby. This test involves the use of radiation. Radiation exposure can be dangerous to a pregnant woman and her unborn baby. If you are pregnant, you generally should not have this procedure done. Slight increase in the risk of cancer. This is because of the radiation involved in the test. What happens before the procedure? No preparation is needed for this procedure. What happens during the procedure? You will undress and remove any jewelry around your neck or chest. You will put on a hospital gown. Sticky electrodes will be placed on your chest. The electrodes will be connected to an electrocardiogram (ECG) machine to record a tracing of the electrical activity of your heart. A CT scanner will take pictures of your heart. During this time, you will be asked to lie still and hold your breath for 2-3 seconds while a picture of your heart is being taken. The procedure may vary among health care providers and hospitals. What happens after the procedure? You can get dressed. You can return to your normal activities. It is up to you to get the results of your test. Ask your health care provider, or the department that is doing the test, when your results will be ready. Summary A coronary calcium scan is an  imaging test used to look for deposits of calcium and other fatty materials (plaques) in the inner lining of the blood vessels of the heart (coronary arteries). Generally, this is a safe procedure. Tell your health care provider if you are pregnant or may be pregnant. No preparation is needed for this procedure. A CT scanner will take pictures  of your heart. You can return to your normal activities after the scan is done. This information is not intended to replace advice given to you by your health care provider. Make sure you discuss any questions you have with your health care provider. Document Released: 10/09/2007 Document Revised: 03/01/2016 Document Reviewed: 03/01/2016 Elsevier Interactive Patient Education  2017 [employer].   Follow-Up: At [facility], you and your health needs are our priority.  As part of our continuing mission to provide you with exceptional heart care, we have created designated Provider Care Teams.  These Care Teams include your primary Cardiologist (physician) and Advanced Practice Providers (APPs -  Physician Assistants and Nurse Practitioners) who all work together to provide you with the care you need, when you need it.  We recommend signing up for the patient portal called "MyChart".  Sign up information is provided on this After Visit Summary.  MyChart is used to connect with patients for Virtual Visits (Telemedicine).  Patients are able to view lab/test results, encounter notes, upcoming appointments, etc.  Non-urgent messages can be sent to your provider as well.   To learn more about what you can do with MyChart, go to [email or web address].    Your next appointment:   Follow up as needed following your echocardiogram and calcium score test.    Provider:   Velna Ochs, MD   Other Instructions

## 2023-02-25 NOTE — Progress Notes (Signed)
Cardiology Office Note:  .   Date:  02/25/2023  ID:  Malon Kindle, DOB 06-24-1955, MRN [id number] PCP: Darrow Bussing, MD  [facility] Providers Cardiologist:  None { History of Present Illness: .   Lucretia Pendley is a 67 y.o. female with history of GERD who presents for the evaluation of palpitations at the request of Koirala, Dibas, MD. Seen in ER 11/28/2022 for palpitations with negative work-up. TSH 5.84. EKG normal. CXR normal.   Discussed the use of AI scribe software for clinical note transcription with the patient, who gave verbal consent to proceed.  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 She reports a stressful year, including the loss of her mother, which has resulted in grief and anxiety. She also experienced back-to-back cases of bronchitis, with the second case progressing to pneumonia. She has a history of recurrent bronchitis, having experienced it almost every year. She also reports having had pneumonia three times, influenza twice, and COVID-19 once.  She notes that her palpitations are often related to her thyroid levels and stress. When her TSH levels are above 3, she experiences palpitations, which can last for three to four hours. She also experiences palpitations during periods of high stress, which can be alleviated with Ativan. She reports no chest pain or shortness of breath associated with the palpitations.  She has a goal of losing 50 pounds and increasing her physical activity to improve her overall health. She has recently joined [employer] and is gradually increasing her walking duration. She also has a stationary bike for exercise. She reports no new medications apart from prednisone, which she took during her recent illnesses.          Problem List GERD PVC -remote monitor  3. Asthma  4. Bronchitis  5. Anxiety     ROS: All other ROS reviewed and  negative. Pertinent positives noted in the HPI.     Studies Reviewed: Marland Kitchen       Physical Exam:   VS:  BP (!) 166/90 (BP Location: Left Arm, Patient Position: Sitting, Cuff Size: Large)   Pulse 88   Ht 5\' 4"  (1.626 m)   Wt 201 lb 12.8 oz (91.5 kg)   LMP 04/26/2010   SpO2 97%   BMI 34.64 kg/m    Wt Readings from Last 3 Encounters:  02/25/23 201 lb 12.8 oz (91.5 kg)  03/31/22 200 lb (90.7 kg)  11/07/20 187 lb 13.3 oz (85.2 kg)    GEN: Well nourished, well developed in no acute distress NECK: No JVD; No carotid bruits CARDIAC: RRR, no murmurs, rubs, gallops RESPIRATORY:  Clear to auscultation without rales, wheezing or rhonchi  ABDOMEN: Soft, non-tender, non-distended EXTREMITIES:  No edema; No deformity  ASSESSMENT AND PLAN: .   Assessment and Plan    Palpitations Infrequent episodes of palpitations, described as a pounding sensation in the chest, lasting variable durations. Episodes appear to be triggered by stress, thyroid imbalance, and possibly dehydration. No associated chest pain or shortness of breath. Normal physical exam and EKG. -Order echocardiogram to assess cardiac structure. -Recommend patient to monitor rhythm at home using Apple Watch or Cardia Mobile. -Encourage patient to maintain good hydration, reduce caffeine intake, and ensure adequate sleep. -CAC score for screening  Follow-up Based on results of echocardiogram and patient's self-monitoring of rhythm.              Follow-up: Return if symptoms  worsen or fail to improve.   Signed, Lenna Gilford. Flora Lipps, MD, [facility]  [facility]  [address], [address] [place], [place] [address] [phone]  3:56 PM

## 2023-03-16 ENCOUNTER — Other Ambulatory Visit: Payer: BC Managed Care – PPO

## 2023-03-16 ENCOUNTER — Encounter: Payer: Self-pay | Admitting: Internal Medicine

## 2023-03-16 ENCOUNTER — Ambulatory Visit: Payer: BC Managed Care – PPO | Admitting: Internal Medicine

## 2023-03-16 VITALS — BP 136/84 | HR 85 | Temp 98.1°F | Ht 63.5 in | Wt 199.0 lb

## 2023-03-16 DIAGNOSIS — R768 Other specified abnormal immunological findings in serum: Secondary | ICD-10-CM | POA: Diagnosis not present

## 2023-03-16 DIAGNOSIS — J4520 Mild intermittent asthma, uncomplicated: Secondary | ICD-10-CM | POA: Diagnosis not present

## 2023-03-16 DIAGNOSIS — Z9109 Other allergy status, other than to drugs and biological substances: Secondary | ICD-10-CM

## 2023-03-16 DIAGNOSIS — J309 Allergic rhinitis, unspecified: Secondary | ICD-10-CM

## 2023-03-16 LAB — POCT EXHALED NITRIC OXIDE: FeNO level (ppb): 14

## 2023-03-16 NOTE — Patient Instructions (Addendum)
It was a pleasure to see you today!  Please schedule follow up scheduled with myself in 6 months.  If my schedule is not open yet, we will contact you with a reminder closer to that time. Please call [phone] if you haven't heard from [place] a month before, and always call us sooner if issues or concerns arise. You can also send [place] a message through MyChart, but but aware that this is not to be used for urgent issues and it may take up to 5-7 days to receive a reply. Please be aware that you will likely be able to view your results before I have a chance to respond to them. Please give [place] 5 business days to respond to any non-urgent results.    Start taking flonase.  Continue advair and albuterol as detailed below  Continue using advair as needed when sick. Because you do not have daily symptoms, using this inhaler on an as needed basis may be sufficient.  I recommend starting to use the inhaler daily as prescribed if you have symptoms of asthma such as chest tightness, wheezing, coughing, shortness of breath. You should also start using it if you have exposure to a sick contact, worsening allergies, or any other trigger for your asthma. I recommend you keep using it even after your respiratory symptoms resolve for 3-4 days. The goal of this therapy is to prevent your symptoms from becoming a flare severe enough to require steroids like prednisone.   Please call our office if using this inhaler on an as needed basis is not sufficient. You might need to be seen sooner than our scheduled follow up.   Take the albuterol rescue inhaler every 4 to 6 hours as needed for wheezing or shortness of breath. You can also take it 15 minutes before exercise or exertional activity. Side effects include heart racing or pounding, jitters or anxiety. If you have a history of an irregular heart rhythm, it can make this worse. Can also give some patients a hard time sleeping.  Flonase - 1 spray on each side of your  nose twice a day for first week, then 1 spray on each side.   Instructions for use: If you also use a saline nasal spray or rinse, use that first. Position the head with the chin slightly tucked. Use the right hand to spray into the left nostril and the right hand to spray into the left nostril.   Point the bottle away from the septum of your nose (cartilage that divides the two sides of your nose).  Hold the nostril closed on the opposite side from where you will spray Spray once and gently sniff to pull the medicine into the higher parts of your nose.  Don't sniff too hard as the medicine will drain down the back of your throat instead. Repeat with a second spray on the same side if prescribed. Repeat on the other side of your nose.  By learning about asthma and how it can be controlled, you take an important step toward managing this disease. Work closely with your asthma care team to learn all you can about your asthma, how to avoid triggers, what your medications do, and how to take them correctly. With proper care, you can live free of asthma symptoms and maintain a normal, healthy lifestyle.   What is asthma? Asthma is a chronic disease that affects the airways of the lungs. During normal breathing, the bands of muscle that surround the airways  are relaxed and air moves freely. During an asthma episode or "attack," there are three main changes that stop air from moving easily through the airways: The bands of muscle that surround the airways tighten and make the airways narrow. This tightening is called bronchospasm.  The lining of the airways becomes swollen or inflamed.  The cells that line the airways produce more mucus, which is thicker than normal and clogs the airways.  These three factors - bronchospasm, inflammation, and mucus production - cause symptoms such as difficulty breathing, wheezing, and coughing.  What are the most common symptoms of asthma? Asthma symptoms are not the  same for everyone. They can even change from episode to episode in the same person. Also, you may have only one symptom of asthma, such as cough, but another person may have all the symptoms of asthma. It is important to know all the symptoms of asthma and to be aware that your asthma can present in any of these ways at any time. The most common symptoms include: Coughing, especially at night  Shortness of breath  Wheezing  Chest tightness, pain, or pressure   Who is affected by asthma? Asthma affects 22 million Americans; about 6 million of these are children under age 22. People who have a family history of asthma have an increased risk of developing the disease. Asthma is also more common in people who have allergies or who are exposed to tobacco smoke. However, anyone can develop asthma at any time. Some people may have asthma all of their lives, while others may develop it as adults.  What causes asthma? The airways in a person with asthma are very sensitive and react to many things, or "triggers." Contact with these triggers causes asthma symptoms. One of the most important parts of asthma control is to identify your triggers and then avoid them when possible. The only trigger you do not want to avoid is exercise. Pre-treatment with medicines before exercise can allow you to stay active yet avoid asthma symptoms. Common asthma triggers include: Infections (colds, viruses, flu, sinus infections)  Exercise  Weather (changes in temperature and/or humidity, cold air)  Tobacco smoke  Allergens (dust mites, pollens, pets, mold spores, cockroaches, and sometimes foods)  Irritants (strong odors from cleaning products, perfume, wood smoke, air pollution)  Strong emotions such as crying or laughing hard  Some medications   How is asthma diagnosed? To diagnose asthma, your doctor will first review your medical history, family history, and symptoms. Your doctor will want to know any past history of  breathing problems you may have had, as well as a family history of asthma, allergies, eczema (a bumpy, itchy skin rash caused by allergies), or other lung disease. It is important that you describe your symptoms in detail (cough, wheeze, shortness of breath, chest tightness), including when and how often they occur. The doctor will perform a physical examination and listen to your heart and lungs. He or she may also order breathing tests, allergy tests, blood tests, and chest and sinus X-rays. The tests will find out if you do have asthma and if there are any other conditions that are contributing factors.  How is asthma treated? Asthma can be controlled, but not cured. It is not normal to have frequent symptoms, trouble sleeping, or trouble completing tasks. Appropriate asthma care will prevent symptoms and visits to the emergency room and hospital. Asthma medicines are one of the mainstays of asthma treatment. The drugs used to treat asthma are  explained below.  Anti-inflammatories: These are the most important drugs for most people with asthma. Anti-inflammatory drugs reduce swelling and mucus production in the airways. As a result, airways are less sensitive and less likely to react to triggers. These medications need to be taken daily and may need to be taken for several weeks before they begin to control asthma. Anti-inflammatory medicines lead to fewer symptoms, better airflow, less sensitive airways, less airway damage, and fewer asthma attacks. If taken every day, they CONTROL or prevent asthma symptoms.   Bronchodilators: These drugs relax the muscle bands that tighten around the airways. This action opens the airways, letting more air in and out of the lungs and improving breathing. Bronchodilators also help clear mucus from the lungs. As the airways open, the mucus moves more freely and can be coughed out more easily. In short-acting forms, bronchodilators RELIEVE or stop asthma symptoms by  quickly opening the airways and are very helpful during an asthma episode. In long-acting forms, bronchodilators provide CONTROL of asthma symptoms and prevent asthma episodes.  Asthma drugs can be taken in a variety of ways. Inhaling the medications by using a metered dose inhaler, dry powder inhaler, or nebulizer is one way of taking asthma medicines. Oral medicines (pills or liquids you swallow) may also be prescribed.  Asthma severity Asthma is classified as either "intermittent" (comes and goes) or "persistent" (lasting). Persistent asthma is further described as being mild, moderate, or severe. The severity of asthma is based on how often you have symptoms both during the day and night, as well as by the results of lung function tests and by how well you can perform activities. The "severity" of asthma refers to how "intense" or "strong" your asthma is.  Asthma control Asthma control is the goal of asthma treatment. Regardless of your asthma severity, it may or may not be controlled. Asthma control means: You are able to do everything you want to do at work and home  You have no (or minimal) asthma symptoms  You do not wake up from your sleep or earlier than usual in the morning due to asthma  You rarely need to use your reliever medicine (inhaler)  Another major part of your treatment is that you are happy with your asthma care and believe your asthma is controlled.  Monitoring symptoms A key part of treatment is keeping track of how well your lungs are working. Monitoring your symptoms, what they are, how and when they happen, and how severe they are, is an important part of being able to control your asthma.  Sometimes asthma is monitored using a peak flow meter. A peak flow (PF) meter measures how fast the air comes out of your lungs. It can help you know when your asthma is getting worse, sometimes even before you have symptoms. By taking daily peak flow readings, you can learn when to  adjust medications to keep asthma under good control. It is also used to create your asthma action plan (see below). Your doctor can use your peak flow readings to adjust your treatment plan in some cases.  Asthma Action Plan Based on your history and asthma severity, you and your doctor will develop a care plan called an "asthma action plan." The asthma action plan describes when and how to use your medicines, actions to take when asthma worsens, and when to seek emergency care. Make sure you understand this plan. If you do not, ask your asthma care provider any questions you may  have. Your asthma action plan is one of the keys to controlling asthma. Keep it readily available to remind you of what you need to do every day to control asthma and what you need to do when symptoms occur.  Goals of asthma therapy These are the goals of asthma treatment: Live an active, normal life  Prevent chronic and troublesome symptoms  Attend work or school every day  Perform daily activities without difficulty  Stop urgent visits to the doctor, emergency department, or hospital  Use and adjust medications to control asthma with few or no side effects    How Can I Prevent Allergic Reactions to Dust Mites?  Remember, having dust mites doesn't mean your house isn't clean. In most areas of the world, these creatures live in every home, no matter how clean. But, you can reduce their effects. There are many changes you can make to your home to reduce the numbers of these unwanted "guests."  Studies show that more dust mites live in your bedroom than anywhere else in your home. So this is the best place to start.  Cover mattresses and pillows in zippered dust-proof covers. These covers are made of a material with pores too small to let dust mites and their waste product through. They are also called allergen-impermeable. Plastic or vinyl covers are the least expensive, but some people find them uncomfortable. You can  buy other fabric allergen-impermeable covers from many regular bedding stores.  Wash your sheets and blankets weekly in hot water. You have to wash them in water that's at least 130 degrees Fahrenheit or more to kill dust mites.  Get rid of all types of fabric that mites love and that you cannot easily wash regularly in hot water. Avoid wall-to-wall carpeting, curtains, blinds, upholstered furniture and down-filled covers and pillows in the bedroom. Put roll-type shades on your windows instead of curtains.  Have someone without a dust mite allergy clean your bedroom. If this is not possible, wear a filtering mask when dusting or vacuuming. Many drug stores carry these items. Dusting and vacuuming stir up dust. So try to do these chores when you can stay out of the bedroom for a while afterward.  Special CERTIFIED filter vacuum cleaners can help to keep mites and mite waste from getting back into the air. CERTIFIED vacuums are scientifically tested and verified as more suitable for making your home healthier.  Vacuuming is not enough to remove all dust mites and their waste. A large amount of the dust mite population may remain because they live deep inside the stuffing of sofas, chairs, mattresses, pillows and carpeting. Treat other rooms in your house like your bedroom. Here are more tips:  Avoid wall-to-wall carpeting, if possible. If you do use carpeting, mites don't like the type with a short, tight pile as much. Use washable throw rugs over regularly damp-mopped wood, linoleum or tiled floors.  Wash rugs in hot water whenever possible. Cold water isn't as effective. Dry cleaning kills all dust mites and is also good for removing dust mites from living in fabrics. Keep the humidity in your home less than 50 percent. Use a dehumidifier and/or air conditioner to do this.  Use a CERTIFIED filter with your central furnace and air conditioning unit. This can help trap dust mites from your entire  home. Freestanding air cleaners only filter air in a limited area. Avoid devices that treat air with heat, electrostatic ions or ozone.  Websites for allergy bedding covers:  Https://www.asthmaandallergyfriendly.com/  https://www.natlallergy.com/

## 2023-03-16 NOTE — Progress Notes (Signed)
Daisy [name]    [id number]    02-12-1956  Primary Care Physician:Koirala, Dibas, MD  Referring Physician: Darrow Bussing, MD [address] [place],  [place] [address] Reason for Consultation: shortness of breath, recurrent bronchitis Date of Consultation: 03/16/2023  Chief complaint:   Chief Complaint  Patient presents with   Consult    Recurrent bronchitis most of life.  2 episodes began May 2024.  Pneumonia July 2024.     HPI: Daisy [name] is a 67 y.o. woman who presents for new patient evaluation for recurrent bronchitis.    Symptoms started in childhood with bronchitis regularly in the fall with "change of the weather." Anytime she has a URI it very easily turns into bronchitis. She has had pneumonia 3 times, but never requiring hospitalization. She has had influenza x2 and covid x1.   This past summer she had bronchitis in May treated with azithromycin. Had a second episode in July. She was then told she had pneumonia later that month. She was given prednisone and that relieved her symptoms fairly quickly.   She has advair which she uses when she's sick only. Which she has been on for ten years.  Less prednisone use once she started on the intermittent advair.   She has a history of heart palpitations attributed to her thyroid condition and anxiety.   She denies reflux. She has no symptoms that limit ADLs outside of bronchitis. She does have seasonal allergies but she takes doxepin which does seem to help seasonal allergies.   She did do RAST testing in 1987 which showed IgE 321 and did have some sensititvity to dust mites, molds, weeds.   Social history:  Occupation: She works as a professor of audiology Exposures: originally from [place], lives at home independently, pet cat Smoking history: never smoker, passive smoke exposure in childhood  Social History   Occupational History   Not on file  Tobacco Use   Smoking status:  Never   Smokeless tobacco: Never  Vaping Use   Vaping status: Never Used  Substance and Sexual Activity   Alcohol use: No    Alcohol/week: 0.0 standard drinks of alcohol   Drug use: No   Sexual activity: Never    Birth control/protection: Abstinence, Post-menopausal    Comment: pt. is a virgin    Relevant family history:  Family History  Problem Relation Age of Onset   Osteoarthritis Mother    Thyroid disease Mother    Other Mother        fractured disc   Lung cancer Father        mets to liver/dec'd age 51   Hypertension Father    Thyroid disease Maternal Grandmother    Asthma Neg Hx     Past Medical History:  Diagnosis Date   Anxiety    Arthritis of both knees 2021   Broken foot 2013   right   Bronchitis    Depression    Family history of adverse reaction to anesthesia    mother with PONV after gallbladder removal   Hypothyroidism    OCD (obsessive compulsive disorder)    PONV (postoperative nausea and vomiting)    after D&C   Retinal tear, bilateral    03/2016    Thyroid disease 1994   hypothyroid--Dr. Fonnie Birkenhead   Vitamin D deficiency     Past Surgical History:  Procedure Laterality Date   CHOLECYSTECTOMY N/A 11/07/2020   Procedure:  LAPAROSCOPIC CHOLECYSTECTOMY;  Surgeon: Fritzi Mandes, MD;  Location: [facility] OR;  Service: General;  Laterality: N/A;   DILATATION & CURRETTAGE/HYSTEROSCOPY WITH RESECTOCOPE N/A 10/18/2012   Procedure: DILATATION & CURETTAGE/HYSTEROSCOPY WITH RESECTOCOPE;  Surgeon: Melony Overly, MD;  Location: [facility] ORS;  Service: Gynecology;  Laterality: N/A;   DILATION AND CURETTAGE OF UTERUS     EYE SURGERY     KNEE ARTHROSCOPY W/ MENISCAL REPAIR Left 10/2019   LAPAROSCOPY Left 10/18/2012   Procedure: LEFT S&O;  Surgeon: Melony Overly, MD;  Location: [facility] ORS;  Service: Gynecology;  Laterality: Left;  PELVIC WASHING   RETINAL TEAR REPAIR CRYOTHERAPY Bilateral    03/2016   SALPINGOOPHORECTOMY Bilateral 10/18/2012   Procedure:  SALPINGO OOPHORECTOMY;  Surgeon: Melony Overly, MD;  Location: [facility] OR[facility];  Service: Gynecology;  Laterality: Bilateral;   TONSILLECTOMY     age 36yrs   TONSILLECTOMY AND ADENOIDECTOMY     age 87     Physical Exam: Blood pressure 136/84, pulse 85, temperature 98.1 F (36.7 C), temperature source Oral, height 5' 3.5" (1.613 m), weight 199 lb (90.3 kg), last menstrual period 04/26/2010, SpO2 99%. Gen:      No acute distress ENT:  mallampati Iv, mild nasal debris, no nasal polyps, mucus membranes moist Lungs:    No increased respiratory effort, symmetric chest wall excursion, clear to auscultation bilaterally, no wheezes or crackles CV:         Regular rate and rhythm; no murmurs, rubs, or gallops.  No pedal edema Abd:      + bowel sounds; soft, non-tender; no distension MSK: no acute synovitis of DIP or PIP joints, no mechanics hands.  Skin:      Warm and dry; no rashes Neuro: normal speech, no focal facial asymmetry Psych: alert and oriented x3, normal mood and affect   Data Reviewed/Medical Decision Making:  Independent interpretation of tests: Imaging:  Review of patient's chest xray August 2024 images revealed no acute cardiopulmonary process. The patient's images have been independently reviewed by me.    PFTs: I have personally reviewed the patient's PFTs and      No data to display          Labs:  Lab Results  Component Value Date   WBC 9.6 11/28/2022   HGB 13.9 11/28/2022   HCT 42.1 11/28/2022   MCV 89.0 11/28/2022   PLT 241 11/28/2022   Lab Results  Component Value Date   NA 140 11/28/2022   K 3.8 11/28/2022   CO2 23 11/28/2022   GLUCOSE 114 (H) 11/28/2022   BUN 15 11/28/2022   CREATININE 0.80 11/28/2022   CALCIUM 10.0 11/28/2022   GFRNONAA >60 11/28/2022      Immunization status:  Immunization History  Administered Date(s) Administered   Influenza,inj,Quad PF,6+ Mos 01/26/2017, 12/16/2018     I reviewed prior external note(s) from primary care, ED   I reviewed the result(s) of the labs and imaging as noted above.   I have ordered feno  Assessment:  Mild intermittent asthma, not well controlled Chronic Allergic Rhinitis Elevated IgE   Plan/Recommendations:  Exhaled nitric oxide test today shows 14 ppb.  Symptoms consistent with poorly controlled intermittent asthma.  Continue advair and albuterol. Advair can be intermittent but should be extended to 3-4 days after resolution of bronchitis symptoms. If having recurrent prednisone/abx use would recommend switching twice daily advair as prescribed.   Take the albuterol rescue inhaler every 4 to 6 hours as needed for wheezing or  shortness of breath.  Start taking flonase. For allergic rhinitis.  Dust mite precautions. Will repeat region 2 allergy panel.   We discussed disease management and progression at length today.    Return to Care: Return in about 6 months (around 09/13/2023).  Durel Salts, MD Pulmonary and Critical Care Medicine [facility] Office:[phone]  CC: Darrow Bussing, MD

## 2023-03-25 LAB — RESPIRATORY ALLERGY PROFILE REGION II ~~LOC~~
Allergen, A. alternata, m6: <0.1 kU/L
Allergen, Cedar tree, t12: 0.19 kU/L — ABNORMAL HIGH
Allergen, Comm Silver Birch, t9: <0.1 kU/L
Allergen, Cottonwood, t14: <0.1 kU/L
Allergen, D pternoyssinus,d7: 0.78 kU/L — ABNORMAL HIGH
Allergen, Mouse Urine Protein, e78: <0.1 kU/L
Allergen, Mulberry, t76: <0.1 kU/L
Allergen, Oak,t7: <0.1 kU/L
Allergen, P. notatum, m1: <0.1 kU/L
Aspergillus fumigatus, m3: <0.1 kU/L
Bermuda Grass: <0.1 kU/L
Box Elder IgE: <0.1 kU/L
CLADOSPORIUM HERBARUM (M2) IGE: <0.1 kU/L
COMMON RAGWEED (SHORT) (W1) IGE: 0.28 kU/L — ABNORMAL HIGH
Cat Dander: 5.49 kU/L — ABNORMAL HIGH
Class: 0
Class: 0
Class: 0
Class: 0
Class: 0
Class: 0
Class: 0
Class: 0
Class: 0
Class: 0
Class: 0
Class: 0
Class: 0
Class: 0
Class: 0
Class: 2
Class: 2
Class: 3
Cockroach: 0.17 kU/L — ABNORMAL HIGH
D. farinae: 0.86 kU/L — ABNORMAL HIGH
Dog Dander: 0.12 kU/L — ABNORMAL HIGH
Elm IgE: <0.1 kU/L
IgE (Immunoglobulin E), Serum: 211 kU/L — ABNORMAL HIGH (ref <=114)
Johnson Grass: 0.11 kU/L — ABNORMAL HIGH
Pecan/Hickory Tree IgE: <0.1 kU/L
Rough Pigweed  IgE: <0.1 kU/L
Sheep Sorrel IgE: <0.1 kU/L
Timothy Grass: 0.15 kU/L — ABNORMAL HIGH

## 2023-03-25 LAB — DOG DANDER COMPONENT
Can f 4(e229) IgE: <0.1 kU/L (ref <0.10)
Can f 6(e230) IgE: <0.1 kU/L (ref <0.10)
E101-IgE Can f 1: <0.1 kU/L (ref <0.10)
E102-IgE Can f 2: 0.12 kU/L — ABNORMAL HIGH (ref <0.10)
E221-IgE Can f 3: 0.11 kU/L — ABNORMAL HIGH (ref <0.10)
E226-IgE Can f 5: <0.1 kU/L (ref <0.10)

## 2023-03-25 LAB — CAT DANDER COMPONENT
E220-IgE Fel d 2: 0.2 kU/L — ABNORMAL HIGH (ref <0.10)
E228-IgE Fel d 4: 0.33 kU/L — ABNORMAL HIGH (ref <0.10)
Fel d 1 (e94) IgE: 5.04 kU/L — ABNORMAL HIGH (ref <0.10)
Fel d 7 (e231) IgE: <0.1 kU/L (ref <0.10)

## 2023-03-25 LAB — INTERPRETATION:

## 2023-04-04 ENCOUNTER — Ambulatory Visit (HOSPITAL_COMMUNITY): Payer: BC Managed Care – PPO

## 2023-05-04 ENCOUNTER — Encounter (HOSPITAL_BASED_OUTPATIENT_CLINIC_OR_DEPARTMENT_OTHER): Payer: Self-pay | Admitting: Emergency Medicine

## 2023-05-04 ENCOUNTER — Emergency Department (HOSPITAL_BASED_OUTPATIENT_CLINIC_OR_DEPARTMENT_OTHER)
Admission: EM | Admit: 2023-05-04 | Discharge: 2023-05-04 | Discharge status: Against Medical Advice | Payer: 59 | Attending: Emergency Medicine | Admitting: Emergency Medicine

## 2023-05-04 ENCOUNTER — Other Ambulatory Visit: Payer: Self-pay

## 2023-05-04 DIAGNOSIS — H5789 Other specified disorders of eye and adnexa: Secondary | ICD-10-CM | POA: Insufficient documentation

## 2023-05-04 DIAGNOSIS — Z5321 Procedure and treatment not carried out due to patient leaving prior to being seen by health care provider: Secondary | ICD-10-CM | POA: Insufficient documentation

## 2023-05-04 NOTE — ED Triage Notes (Signed)
 Pt c/o eye irritation after getting magnesium body butter in her eyes around midnight. Pt read on google to irrigate and then seek medical attention. Pt states that she irrigated her eyes for about 5 minutes. Pt's has some redness to bilateral eyes.

## 2023-05-06 ENCOUNTER — Other Ambulatory Visit (HOSPITAL_COMMUNITY): Payer: BC Managed Care – PPO

## 2023-05-06 ENCOUNTER — Ambulatory Visit (HOSPITAL_COMMUNITY): Payer: 59

## 2023-07-04 ENCOUNTER — Ambulatory Visit (HOSPITAL_COMMUNITY): Payer: 59 | Attending: Cardiovascular Disease

## 2023-07-04 ENCOUNTER — Other Ambulatory Visit (HOSPITAL_COMMUNITY): Payer: Self-pay

## 2023-07-04 ENCOUNTER — Ambulatory Visit (HOSPITAL_COMMUNITY)
Admission: RE | Admit: 2023-07-04 | Discharge: 2023-07-04 | Disposition: A | Discharge status: Home / Self Care | Payer: Self-pay | Source: Ambulatory Visit | Attending: Cardiovascular Disease | Admitting: Cardiovascular Disease

## 2023-07-04 DIAGNOSIS — R002 Palpitations: Secondary | ICD-10-CM | POA: Insufficient documentation

## 2023-07-04 DIAGNOSIS — I517 Cardiomegaly: Secondary | ICD-10-CM | POA: Diagnosis not present

## 2023-07-04 LAB — ECHOCARDIOGRAM COMPLETE
Area-P 1/2: 2.46 cm2
S' Lateral: 2.4 cm

## 2023-07-05 ENCOUNTER — Encounter: Payer: Self-pay | Admitting: Cardiovascular Disease

## 2023-07-10 ENCOUNTER — Encounter: Payer: Self-pay | Admitting: Cardiovascular Disease

## 2023-08-08 ENCOUNTER — Ambulatory Visit: Admitting: Cardiology

## 2023-08-15 ENCOUNTER — Ambulatory Visit: Payer: Self-pay | Admitting: Internal Medicine

## 2023-08-15 NOTE — Telephone Encounter (Signed)
 E2C2 Pulmonary Triage - Initial Assessment Questions "Chief Complaint (e.g., cough, sob, wheezing, fever, chills, sweat or additional symptoms) *Go to specific symptom protocol after initial questions. Patient called with concerns for possible heart palpitations. Patient endorses hx of heart palpitations with her thyroid  and anxiety. Patient calling questioning if her Advair inhaler is attributing to her heart palpitations. Heart palpitations are lasting anywhere from 10 minutes to close to an hour.  Patient denies chest pain or shortness of breath. Patient states her PCP told her to start using Advair 2 puffs BID two weeks ago after she came down with bronchitis. Patient states she noticed more episodes of palpitations starting last week. Patient reports she will take an ativan  to help calm herself down and then states she will have to take another one a couple of hours later. Patient is wishing to speak with her pulmonologist about the Advair inhaler. Patient is needing a phone call from staff with recommendations for her. Patient verbalized understanding of plan and all questions answered. Patient given instructions of ED precautions.   "How long have symptoms been present?" Started last week  Have you tested for COVID or Flu? Note: If not, ask patient if a home test can be taken. If so, instruct patient to call back for positive results. No  MEDICINES:   "Have you used any OTC meds to help with symptoms?" No If yes, ask "What medications?"  "Have you used your inhalers/maintenance medication?" Yes If yes, "What medications?" Advair  Albuterol   If inhaler, ask "How many puffs and how often?" Note: Review instructions on medication in the chart. Advair patient reports taken 2 puffs that her PCP told her to take Albuterol   2 puffs Q6H PRN  OXYGEN: "Do you wear supplemental oxygen?" No If yes, "How many liters are you supposed to use?"   "Do you monitor your oxygen levels?" No If yes,  "What is your reading (oxygen level) today?"   "What is your usual oxygen saturation reading?"  (Note: Pulmonary O2 sats should be 90% or greater) Unable to tell what her oxygen saturation is  Copied from CRM [id number]. Topic: Clinical - Red Word Triage >> Aug 15, 2023  3:53 PM Daisy [name] wrote: Red Word that prompted transfer to Nurse Triage: pt having heart palpitations.  Could this be from Advair?  She just started this med 2 weeks ago Reason for Disposition  [1] Palpitations AND [2] no improvement after using Care Advice  Answer Assessment - Initial Assessment Questions 1. DESCRIPTION: "Please describe your heart rate or heartbeat that you are having" (e.g., fast/slow, regular/irregular, skipped or extra beats, "palpitations")     palpitations 2. ONSET: "When did it start?" (Minutes, hours or days)      Started last Tuesday 3. DURATION: "How long does it last" (e.g., seconds, minutes, hours)     Can last anywhere from a few minutes to an hour.  4. PATTERN "Does it come and go, or has it been constant since it started?"  "Does it get worse with exertion?"   "Are you feeling it now?"     Comes and goes 5. TAP: "Using your hand, can you tap out what you are feeling on a chair or table in front of you, so that I can hear?" (Note: not all patients can do this)       N/A 6. HEART RATE: "Can you tell me your heart rate?" "How many beats in 15 seconds?"  (Note: not all patients can do this)  N/A 7. RECURRENT SYMPTOM: "Have you ever had this before?" If Yes, ask: "When was the last time?" and "What happened that time?"      Been happening on and off.  8. CAUSE: "What do you think is causing the palpitations?"     Patient is concerned that her Advair is attributing to her palpitations 9. CARDIAC HISTORY: "Do you have any history of heart disease?" (e.g., heart attack, angina, bypass surgery, angioplasty, arrhythmia)      Hx of palpitations 10. OTHER SYMPTOMS: "Do you have any other  symptoms?" (e.g., dizziness, chest pain, sweating, difficulty breathing)       No  Protocols used: Heart Rate and Heartbeat Questions-A-AH

## 2023-08-16 NOTE — Telephone Encounter (Signed)
 Dr. Gaynell Keeler, Please advise regarding Advair and palpitations.  Dr. Dione Franks is out of the office the rest of the week.  Thank you.

## 2023-08-24 ENCOUNTER — Encounter: Payer: Self-pay | Admitting: Cardiovascular Disease

## 2023-09-07 NOTE — Progress Notes (Deleted)
 Error

## 2023-09-09 ENCOUNTER — Ambulatory Visit: Admitting: General Practice

## 2023-09-27 ENCOUNTER — Ambulatory Visit: Admitting: Internal Medicine

## 2023-09-30 ENCOUNTER — Encounter: Payer: Self-pay | Admitting: Cardiovascular Disease

## 2023-09-30 NOTE — Progress Notes (Unsigned)
 Cardiology Clinic Note   Patient Name: Daisy [name] Date of Encounter: 10/03/2023  Primary Care Provider:  Lanae Pinal, MD Primary Cardiologist:  Oneil Bigness, MD  Patient Profile    Laylee Schooley 68 year old female presents the clinic today for follow-up evaluation of her coronary artery disease and palpitations.  Past Medical History    Past Medical History:  Diagnosis Date   Anxiety    Arthritis of both knees 2021   Broken foot 2013   right   Bronchitis    Depression    Family history of adverse reaction to anesthesia    mother with PONV after gallbladder removal   Hypothyroidism    OCD (obsessive compulsive disorder)    PONV (postoperative nausea and vomiting)    after D&C   Retinal tear, bilateral    03/2016    Thyroid  disease 1994   hypothyroid--Dr. Jan Mcgill   Vitamin D deficiency    Past Surgical History:  Procedure Laterality Date   CHOLECYSTECTOMY N/A 11/07/2020   Procedure: LAPAROSCOPIC CHOLECYSTECTOMY;  Surgeon: Lujean Sake, MD;  Location: [facility] OR;  Service: General;  Laterality: N/A;   DILATATION & CURRETTAGE/HYSTEROSCOPY WITH RESECTOCOPE N/A 10/18/2012   Procedure: DILATATION & CURETTAGE/HYSTEROSCOPY WITH RESECTOCOPE;  Surgeon: Lawton Price, MD;  Location: [facility];  Service: Gynecology;  Laterality: N/A;   DILATION AND CURETTAGE OF UTERUS     EYE SURGERY     KNEE ARTHROSCOPY W/ MENISCAL REPAIR Left 10/2019   LAPAROSCOPY Left 10/18/2012   Procedure: LEFT S&O;  Surgeon: Lawton Price, MD;  Location: [facility];  Service: Gynecology;  Laterality: Left;  PELVIC WASHING   RETINAL TEAR REPAIR CRYOTHERAPY Bilateral    03/2016   SALPINGOOPHORECTOMY Bilateral 10/18/2012   Procedure: SALPINGO OOPHORECTOMY;  Surgeon: Lawton Price, MD;  Location: [facility];  Service: Gynecology;  Laterality: Bilateral;   TONSILLECTOMY     age 40yrs   TONSILLECTOMY AND ADENOIDECTOMY     age 4    Allergies  Allergies  Allergen Reactions    Diclofenac Other (See Comments)    Gi -upset   Penicillins Other (See Comments)    Found out on allergy  testing Reaction: over 20 years ago   Sudafed [Pseudoephedrine Hcl] Other (See Comments)    Feels wired and like has had too much coffee   Thimerosal (Thiomersal) Rash    History of Present Illness    Ms. Wulf has a PMH of palpitations, shortness of breath, obesity, GERD, and coronary artery disease.  Coronary calcium scoring on 07/04/2023 showed a total score of 47 in the LAD region.  She was seen in follow-up by Dr. Rolm Clos on 02/25/2023.  During that time she presented with palpitations.  She reported a stressful year.  She noted that she had lost her mother.  She was experiencing grief and anxiety with this.  She had back-to-back episodes of bronchitis.  The second case progressed to pneumonia.  She noted that she would have bronchitis almost every year.  She reported that she had pneumonia 3 times, influenza twice, and COVID once.  She reported that she had palpitations that were related to her thyroid  levels and stress.  She reported that when her thyroid  levels were above 3 she would experience palpitations.  The palpitations would last for 3-4 hours.  She also noted palpitations during episodes of high stress.  Her palpitations were alleviated with the use of Ativan .  She denied chest pain and shortness of breath with palpitations.  She had a goal of losing around 50 pounds, increasing her physical activity.  She had joined weight watchers was recommended that she continue to monitor her heart rhythm with her Apple Watch or Kardia mobile.  She was encouraged to maintain hydration, reduce caffeine intake and get an adequate amount of sleep.  Coronary calcium scoring was ordered.  She presents to the clinic today for follow-up evaluation and states she continues to have intermittent episodes of palpitations which she relates to her bronchitis.  We reviewed her intermittent episodes over the  past several months.  She has also been seen by pulmonology.  They reviewed her symptoms and felt she had mild asthma.  Her PCP did not agree with this diagnosis and felt that she has reactive airway disease.  We reviewed her cardiac event monitor results and coronary calcium scoring.  I reviewed triggers for palpitations.  She expressed understanding.  I also discussed vagal maneuvers and will have her try these for sustained episodes of SVT.  At this time she wishes to continue to work on her diet and increasing physical activity.  I will repeat her fasting lipids in 3 months.  We will repeat her TSH at the same time and plan follow-up after lab work in about 4 months.  Today she denies chest pain, shortness of breath, lower extremity edema, and fatigue.   Home Medications    Prior to Admission medications   Medication Sig Start Date End Date Taking? Authorizing Provider  Cholecalciferol (VITAMIN D3) 2000 units TABS Take 2,000 Units by mouth daily.    [provider]  doxepin  (SINEQUAN ) 25 MG capsule Take 25 mg by mouth daily. 01/25/23   [provider]  doxepin  (SINEQUAN ) 50 MG capsule Take 50 mg by mouth at bedtime.    [provider]  fexofenadine (ALLEGRA) 60 MG tablet Take 60 mg by mouth daily as needed for allergies.    [provider]  fluticasone (FLONASE) 50 MCG/ACT nasal spray Place 1 spray into both nostrils daily as needed for allergies. 04/01/14   [provider]  Fluticasone-Salmeterol (ADVAIR) 250-50 MCG/DOSE AEPB Inhale 1 puff into the lungs daily as needed (Shortness of breath when sick and bronchitis).    [provider]  ibuprofen  (ADVIL ,MOTRIN ) 800 MG tablet Take 1 tablet (800 mg total) by mouth every 8 (eight) hours as needed for pain. Patient taking differently: Take 400 mg by mouth every 8 (eight) hours as needed for pain. 10/18/12   Greta Leatherwood, MD  LORazepam  (ATIVAN ) 0.5 MG tablet Take 1 mg by mouth daily as  needed for anxiety. 05/15/20   [provider]  LORazepam  (ATIVAN ) 1 MG tablet Take 1 mg by mouth 2 (two) times daily as needed. 04/12/23   [provider]  melatonin 5 MG TABS Take 5 mg by mouth at bedtime.    [provider]  Multiple Vitamins-Minerals (MULTIVITAMIN PO) Take 1 tablet by mouth daily.    [provider]  SYNTHROID 112 MCG tablet Take 112 mcg by mouth daily before breakfast. 03/28/19   [provider]  VENTOLIN  HFA 108 (90 BASE) MCG/ACT inhaler Inhale 2 puffs into the lungs every 6 (six) hours as needed (only when sick with  bronchiti). 04/01/14   [provider]  vitamin C (ASCORBIC ACID) 500 MG tablet Take 500 mg by mouth daily.    [provider]    Family History    Family History  Problem Relation Age of Onset  Osteoarthritis Mother    Thyroid  disease Mother    Other Mother        fractured disc   Lung cancer Father        mets to liver/dec'd age 5   Hypertension Father    Thyroid  disease Maternal Grandmother    Asthma Neg Hx    She indicated that her mother is alive. She indicated that her father is deceased. She indicated that her brother is alive. She indicated that the status of her maternal grandmother is unknown. She indicated that the status of her neg hx is unknown.  Social History    Social History   Socioeconomic History   Marital status: Single    Spouse name: Not on file   Number of children: Not on file   Years of education: Not on file   Highest education level: Not on file  Occupational History   Not on file  Tobacco Use   Smoking status: Never   Smokeless tobacco: Never  Vaping Use   Vaping status: Never Used  Substance and Sexual Activity   Alcohol use: No    Alcohol/week: 0.0 standard drinks of alcohol   Drug use: No   Sexual activity: Never    Birth control/protection: Abstinence, Post-menopausal    Comment: pt. is a virgin  Other Topics Concern   Not on file   Social History Narrative   Not on file   Social Drivers of Health   Financial Resource Strain: Not on file  Food Insecurity: Not on file  Transportation Needs: Not on file  Physical Activity: Not on file  Stress: Not on file  Social Connections: Not on file  Intimate Partner Violence: Not on file     Review of Systems    General:  No chills, fever, night sweats or weight changes.  Cardiovascular:  No chest pain, dyspnea on exertion, edema, orthopnea, palpitations, paroxysmal nocturnal dyspnea. Dermatological: No rash, lesions/masses Respiratory: No cough, dyspnea Urologic: No hematuria, dysuria Abdominal:   No nausea, vomiting, diarrhea, bright red blood per rectum, melena, or hematemesis Neurologic:  No visual changes, wkns, changes in mental status. All other systems reviewed and are otherwise negative except as noted above.  Physical Exam    VS:  BP 128/78   Pulse 96   Ht 5' 3.5" (1.613 m)   Wt 196 lb (88.9 kg)   LMP 04/26/2010   SpO2 96%   BMI 34.18 kg/m  , BMI Body mass index is 34.18 kg/m. GEN: Well nourished, well developed, in no acute distress. HEENT: normal. Neck: Supple, no JVD, carotid bruits, or masses. Cardiac: RRR, no murmurs, rubs, or gallops. No clubbing, cyanosis, edema.  Radials/DP/PT 2+ and equal bilaterally.  Respiratory:  Respirations regular and unlabored, clear to auscultation bilaterally. GI: Soft, nontender, nondistended, BS + x 4. MS: no deformity or atrophy. Skin: warm and dry, no rash. Neuro:  Strength and sensation are intact. Psych: Normal affect.  Accessory Clinical Findings    Recent Labs: 11/28/2022: BUN 15; Creatinine, Ser 0.80; Hemoglobin 13.9; Platelets 241; Potassium 3.8; Sodium 140; TSH 5.840   Recent Lipid Panel No results found for: "CHOL", "TRIG", "HDL", "CHOLHDL", "VLDL", "LDLCALC", "LDLDIRECT"       ECG personally reviewed by me today-none today.    Coronary calcium scoring 07/04/2023  EXAM: Coronary Calcium  Score   TECHNIQUE: A gated, non-contrast computed tomography scan of the heart was performed using 3mm slice thickness. Axial images were analyzed on a dedicated workstation. Calcium scoring of  the coronary arteries was performed using the Agatston method.   FINDINGS: Coronary Calcium Score:   Left main: 0   Left anterior descending artery: 47   Left circumflex artery: 0   Right coronary artery: 0   Total: 47   Percentile: 69th   Pericardium: Normal.   Non-cardiac: See separate report from [facility].   IMPRESSION: 1. Coronary calcium score of 47. This was 69th percentile for age-, race-, and sex-matched controls.   RECOMMENDATIONS: Coronary artery calcium (CAC) score is a strong predictor of incident coronary heart disease (CHD) and provides predictive information beyond traditional risk factors. CAC scoring is reasonable to use in the decision to withhold, postpone, or initiate statin therapy in intermediate-risk or selected borderline-risk asymptomatic adults (age 53-75 years and LDL-C >=70 to <190 mg/dL) who do not have diabetes or established atherosclerotic cardiovascular disease (ASCVD).* In intermediate-risk (10-year ASCVD risk >=7.5% to <20%) adults or selected borderline-risk (10-year ASCVD risk >=5% to <7.5%) adults in whom a CAC score is measured for the purpose of making a treatment decision the following recommendations have been made:   If CAC=0, it is reasonable to withhold statin therapy and reassess in 5 to 10 years, as long as higher risk conditions are absent (diabetes mellitus, family history of premature CHD in first degree relatives (males <55 years; females <65 years), cigarette smoking, or LDL >=190 mg/dL).   If CAC is 1 to 99, it is reasonable to initiate statin therapy for patients >=74 years of age.  Echocardiogram 07/04/2023  IMPRESSIONS     1. Difficult acoustic windows.   2. Left ventricular ejection fraction, by  estimation, is 70 to 75%. The  left ventricle has hyperdynamic function. The left ventricle has no  regional wall motion abnormalities. There is moderate left ventricular  hypertrophy. Left ventricular diastolic  parameters are indeterminate.   3. Right ventricular systolic function is normal. The right ventricular  size is normal.   4. Trivial mitral valve regurgitation.   5. The aortic valve is tricuspid. Aortic valve regurgitation is not  visualized.   FINDINGS   Left Ventricle: Left ventricular ejection fraction, by estimation, is 70  to 75%. The left ventricle has hyperdynamic function. The left ventricle  has no regional wall motion abnormalities. The left ventricular internal  cavity size was normal in size.  There is moderate left ventricular hypertrophy. Left ventricular diastolic  parameters are indeterminate.   Right Ventricle: The right ventricular size is normal. Right vetricular  wall thickness was not assessed. Right ventricular systolic function is  normal.   Left Atrium: Left atrial size was normal in size.   Right Atrium: Right atrial size was normal in size.   Pericardium: Trivial pericardial effusion is present.   Mitral Valve: There is mild thickening of the mitral valve leaflet(s).  Trivial mitral valve regurgitation.   Tricuspid Valve: The tricuspid valve is grossly normal. Tricuspid valve  regurgitation is trivial.   Aortic Valve: The aortic valve is tricuspid. Aortic valve regurgitation is  not visualized.   Pulmonic Valve: The pulmonic valve was not well visualized. Pulmonic valve  regurgitation is not visualized.   Aorta: The aortic root and ascending aorta are structurally normal, with  no evidence of dilitation.   IAS/Shunts: No atrial level shunt detected by color flow Doppler.      Assessment & Plan   1.  Coronary artery disease-denies exertional chest discomfort.  Coronary calcium score was 47 on 07/04/2023.  Testing showed coronary  calcium in the  LAD region. Increase physical activity High-fiber diet Lifestyle modification  Palpitations-denies recent episodes of accelerated or irregular heartbeat.  Notes palpitations during setting of increased rest or respiratory illness.  Had cardiac event monitor at PCP.  Dr. Rolm Clos reviewed results.  We discussed SVT Continue to monitor May use vagal maneuvers Avoid triggers caffeine, chocolate, EtOH, dehydration etc. Repeat TSH in 3 months  Hyperlipidemia-LDL 118 on 03/15/2023.  Goal LDL less than 100 Wishes to defer statin therapy at this time.  Would like to continue to work on diet and increasing physical activity.  She plans to start with the [facility] wellness clinic. High-fiber diet Increase physical activity as tolerated Continue weight loss Repeat fasting lipids  in 3 months.  Disposition: Follow-up with Dr. Rolm Clos or me in 3-4 months.    Chet Cota. Kyndal Heringer NP-C     10/03/2023, 11:32 AM [facility] [address] Office [phone] Fax [phone]    I spent 14 minutes examining this patient, reviewing medications, and using patient centered shared decision making involving their cardiac care.   I spent  20 minutes reviewing past medical history,  medications, and prior cardiac tests.

## 2023-10-03 ENCOUNTER — Encounter: Payer: Self-pay | Admitting: General Practice

## 2023-10-03 ENCOUNTER — Ambulatory Visit: Attending: General Practice | Admitting: General Practice

## 2023-10-03 VITALS — BP 128/78 | HR 96 | Ht 63.5 in | Wt 196.0 lb

## 2023-10-03 DIAGNOSIS — E782 Mixed hyperlipidemia: Secondary | ICD-10-CM

## 2023-10-03 DIAGNOSIS — R002 Palpitations: Secondary | ICD-10-CM

## 2023-10-03 DIAGNOSIS — I2510 Atherosclerotic heart disease of native coronary artery without angina pectoris: Secondary | ICD-10-CM

## 2023-10-03 NOTE — Patient Instructions (Signed)
 Medication Instructions:  NO CHANGES *If you need a refill on your cardiac medications before your next appointment, please call your pharmacy*  Lab Work: FASTING LIPID PANEL AND TSH IN 3 MONTHS If you have labs (blood work) drawn today and your tests are completely normal, you will receive your results only by: MyChart Message (if you have MyChart) OR A paper copy in the mail If you have any lab test that is abnormal or we need to change your treatment, we will call you to review the results.  Testing/Procedures: NO TESTING  Follow-Up: At [facility], you and your health needs are our priority.  As part of our continuing mission to provide you with exceptional heart care, our providers are all part of one team.  This team includes your primary Cardiologist (physician) and Advanced Practice Providers or APPs (Physician Assistants and Nurse Practitioners) who all work together to provide you with the care you need, when you need it.  Your next appointment:   4 month(s)  Provider:   Oneil Bigness, MD or Lawana Pray, NP

## 2023-11-06 ENCOUNTER — Encounter: Payer: Self-pay | Admitting: Cardiovascular Disease

## 2023-11-08 ENCOUNTER — Encounter: Payer: Self-pay | Admitting: Internal Medicine

## 2023-11-09 ENCOUNTER — Telehealth (HOSPITAL_BASED_OUTPATIENT_CLINIC_OR_DEPARTMENT_OTHER): Payer: Self-pay

## 2023-11-09 NOTE — Telephone Encounter (Unsigned)
 Copied from CRM [id number]. Topic: Appointments - Transfer of Care >> Nov 09, 2023  3:10 PM Isabell A wrote: Pt is requesting to transfer FROM: Daisy [name]  Pt is requesting to transfer TO: Harden Staff  Reason for requested transfer: Patient states the provider didn't listen well & the information provided was incorrect.  It is the responsibility of the team the patient would like to transfer to (Dr. Alva) to reach out to the patient if for any reason this transfer is not acceptable.   Callback number: [phone]

## 2023-11-10 NOTE — Telephone Encounter (Signed)
**Note De-identified  [place] Obfuscation** Please advise 

## 2023-11-10 NOTE — Telephone Encounter (Signed)
 Scheduled for 9/26. Nothing further needed.

## 2023-11-10 NOTE — Telephone Encounter (Signed)
 Can you schedule with Dr Jude next available?

## 2023-11-30 NOTE — Telephone Encounter (Signed)
 Patient has transitioned care to Dr. Jude. Please direct all further questions to him.

## 2023-11-30 NOTE — Telephone Encounter (Signed)
 Please advise

## 2023-11-30 NOTE — Telephone Encounter (Signed)
**Note De-identified  [place] Obfuscation** Please advise 

## 2023-12-01 NOTE — Telephone Encounter (Signed)
 Discussed with our pharmacy team. No known drug interactions between trelegy and advil .

## 2023-12-18 ENCOUNTER — Encounter: Payer: Self-pay | Admitting: Cardiovascular Disease

## 2024-01-05 ENCOUNTER — Ambulatory Visit: Admitting: Internal Medicine

## 2024-01-20 ENCOUNTER — Ambulatory Visit: Admitting: Radiology

## 2024-01-20 ENCOUNTER — Encounter: Payer: Self-pay | Admitting: Obstetrics and Gynecology

## 2024-01-20 ENCOUNTER — Telehealth: Payer: Self-pay | Admitting: *Deleted

## 2024-01-20 ENCOUNTER — Encounter (HOSPITAL_BASED_OUTPATIENT_CLINIC_OR_DEPARTMENT_OTHER): Payer: Self-pay | Admitting: Pulmonary Disease

## 2024-01-20 ENCOUNTER — Other Ambulatory Visit: Payer: Self-pay | Admitting: Obstetrics and Gynecology

## 2024-01-20 ENCOUNTER — Ambulatory Visit (HOSPITAL_BASED_OUTPATIENT_CLINIC_OR_DEPARTMENT_OTHER): Admitting: Pulmonary Disease

## 2024-01-20 ENCOUNTER — Ambulatory Visit
Admission: RE | Admit: 2024-01-20 | Discharge: 2024-01-20 | Disposition: A | Discharge status: Home / Self Care | Source: Ambulatory Visit | Attending: Obstetrics and Gynecology | Admitting: Obstetrics and Gynecology

## 2024-01-20 VITALS — BP 147/74 | HR 78 | Ht 63.0 in | Wt 191.0 lb

## 2024-01-20 DIAGNOSIS — J302 Other seasonal allergic rhinitis: Secondary | ICD-10-CM | POA: Diagnosis not present

## 2024-01-20 DIAGNOSIS — J42 Unspecified chronic bronchitis: Secondary | ICD-10-CM

## 2024-01-20 DIAGNOSIS — Z0000 Encounter for general adult medical examination without abnormal findings: Secondary | ICD-10-CM

## 2024-01-20 DIAGNOSIS — J411 Mucopurulent chronic bronchitis: Secondary | ICD-10-CM

## 2024-01-20 NOTE — Telephone Encounter (Signed)
 See telephone encounter dated 01/20/24.   Routing FYI.   Encounter closed.

## 2024-01-20 NOTE — Progress Notes (Addendum)
 Subjective:    Patient ID: Daisy [name], female    DOB: Sep 04, 1955, 68 y.o.   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  History of Present Illness Daisy [name] is a 68 year old female with recurrent bronchitis and suspected asthma who presents for evaluation of respiratory symptoms and management of bronchitis.  She has recurrent bronchitis and a persistent cough since childhood, often triggered by cold air and respiratory infections. These infections can last up to three months and often require antibiotics. She was diagnosed with mild intermittent asthma last year and has reactive airway disease.  In the past two summers, she experienced severe respiratory bronchitis with prolonged illness and heart palpitations. An ER visit on November 28, 2022, revealed elevated thyroid  levels, but echocardiogram and CT scan were normal. She was monitored for unsustained SVTs, which have not recurred.  She was prescribed Trelegy for asthma, which initially improved symptoms but caused severe side effects, including anxiety and insomnia. Her current medications include Advair during illness and Ativan  for anxiety. She has allergies to dust mites, mold, and pet dander. No recurrent sinus issues but occasional clear mucus production after meals.       Significant tests/ events reviewed  FENO 02/2023 14 CT cors 06/2023 clear lungs 11/2022 AEC 200 RAST IgE 211, cat ++ ,low grade to dog,grass, cedar   Review of Systems  neg for any significant sore throat, dysphagia, itching, sneezing, nasal congestion or excess/ purulent secretions, fever, chills, sweats, unintended wt loss, pleuritic or exertional cp,  hempoptysis, orthopnea pnd or change in chronic leg swelling. Also denies presyncope, palpitations, heartburn, abdominal pain, nausea, vomiting, diarrhea or change in bowel or urinary habits, dysuria,hematuria, rash, arthralgias, visual complaints, headache, numbness weakness or ataxia.      Objective:   Physical Exam  Gen. Pleasant, well-nourished, in no distress ENT - no thrush, no pallor/icterus,no post nasal drip Neck: No JVD, no thyromegaly, no carotid bruits Lungs: no use of accessory muscles, no dullness to percussion, clear without rales or rhonchi  Cardiovascular: Rhythm regular, heart sounds  normal, no murmurs or gallops, no peripheral edema Musculoskeletal: No deformities, no cyanosis or clubbing        Assessment & Plan:   Assessment and Plan Assessment & Plan Recurrent bronchitis with asthmatic features Chronic bronchitis with episodes of severe cough and mucus production, often triggered by respiratory infections. Symptoms include coughing up colored mucus and heart palpitations during episodes. Previous treatments include Z-Pak and Levaquin. Recent use of Trelegy resulted in severe side effects including anxiety, insomnia, and exacerbation of tinnitus. Current symptoms suggest asthmatic bronchitis rather than true asthma, as symptoms are episodic and related to infections rather than persistent. CT scan shows no structural lung damage. Differential includes recurrent viral infections and asthmatic bronchitis. - Order spirometry pre/post to assess lung capacity. - Advise use of medications only during flare-ups. - Treat with antibiotics if yellow-green sputum is present. - Caution against overuse of albuterol  to avoid palpitations. - Use Advair as needed, one puff twice a day, not exceeding 12 hours to avoid palpitations.  Allergic rhinitis Allergies to dust mites, carpet mites, mold, trees, grasses, dog, ragweed, and cedar. Symptoms are not persistent and do not  require  continuous medication. Flonase is available but not used regularly. - Advise use of Flonase during spring and fall seasons, especially in spring. - Review allergy  panel results to confirm allergens.

## 2024-01-20 NOTE — Telephone Encounter (Signed)
 Patient left message on triage line advising TBC had a cancellation for screening MMG today, she is going at 1330. Call returned to patient to advise message received.

## 2024-01-20 NOTE — Telephone Encounter (Signed)
 Front office scheduled patient for OV on 9/29 with Dr. Nikki.   Per review of EPIC, last AEX 03/31/22 Last MMG 03/26/2019  Spoke with patient. States she is in the process of weaning from doxepin , is currently at half her dosage. This had been helping with hot flashes, experienced 3 last night keep her awake. Patient is requesting Rx for hot flashes for the next 6 months.   Advised she would need OV for evaluation and to further discuss options, keep OV as scheduled. Confirmed last MMG on file 2020, strongly encouraged patient to schedule screening MMG. Advised Dr. Nikki is out of the office today, I will forward message to Dr. Nikki, our office will f/u if any additional recommendations prior to her appt. Patient agreeable.   Routing to provider for final review. Patient is agreeable to disposition. Will close encounter.

## 2024-01-20 NOTE — Patient Instructions (Addendum)
 X spirometry pre-post  FLu shot ??   VISIT SUMMARY: Today, we discussed your respiratory symptoms and management of bronchitis. You have a history of recurrent bronchitis and mild intermittent asthma, with symptoms often triggered by cold air and respiratory infections. We reviewed your current medications and their side effects, and we have made some adjustments to your treatment plan.  YOUR PLAN: -RECURRENT BRONCHITIS WITH ASTHMATIC FEATURES: Recurrent bronchitis with asthmatic features means you have chronic bronchitis with episodes of severe cough and mucus production, often triggered by respiratory infections. We will order a breathing test to assess your lung capacity. Use your medications only during flare-ups, and treat with antibiotics if you notice yellow-green sputum. Avoid overusing albuterol  to prevent heart palpitations. Use Advair as needed, one puff twice a day, but do not exceed 12 hours between doses to avoid palpitations.  -ALLERGIC RHINITIS: Allergic rhinitis means you have allergies to substances like dust mites, mold, and pet dander, which can cause symptoms like a runny or stuffy nose. Use Flonase during the spring and fall seasons, especially in the spring. We will review your allergy  panel results to confirm your specific allergens.  INSTRUCTIONS: Please schedule a breathing test to assess your lung capacity. Use your medications as directed, and contact us  if you notice yellow-green sputum or if your symptoms worsen. Follow up with us  after your breathing test to discuss the results and any further treatment needed.                      Contains text generated by Abridge.                                 Contains text generated by Abridge.

## 2024-01-21 LAB — IGG, IGA, IGM
IgA/Immunoglobulin A, Serum: 278 mg/dL (ref 87–352)
IgG (Immunoglobin G), Serum: 799 mg/dL (ref 586–1602)
IgM (Immunoglobulin M), Srm: 28 mg/dL (ref 26–217)

## 2024-01-23 ENCOUNTER — Ambulatory Visit: Admitting: Obstetrics and Gynecology

## 2024-01-23 ENCOUNTER — Ambulatory Visit: Payer: Self-pay | Admitting: Pulmonary Disease

## 2024-01-23 ENCOUNTER — Encounter: Payer: Self-pay | Admitting: Obstetrics and Gynecology

## 2024-01-23 VITALS — BP 132/86 | HR 86

## 2024-01-23 DIAGNOSIS — N951 Menopausal and female climacteric states: Secondary | ICD-10-CM | POA: Diagnosis not present

## 2024-01-23 NOTE — Patient Instructions (Signed)
Fezolinetant Tablets What is this medication? FEZOLINETANT (FEZ oh LIN e tant) reduces the number and severity of hot flashes due to menopause. It works by blocking substances in your body that cause hot flashes and night sweats. This medicine may be used for other purposes; ask your health care provider or pharmacist if you have questions. COMMON BRAND NAME(S): VEOZAH What should I tell my care team before I take this medication? They need to know if you have any of these conditions: Kidney disease Liver disease An unusual or allergic reaction to fezolinetant, other medications, foods, dyes, or preservatives Pregnant or trying to get pregnant Breastfeeding How should I use this medication? Take this medication by mouth with water. Take it as directed on the prescription label at the same time every day. Do not cut, crush, or chew this medication. Swallow the tablets whole. You can take it with or without food. If it upsets your stomach, take it with food. Keep taking it unless your care team tells you to stop. Talk to your care team about the use of this medication in children. Special care may be needed. Overdosage: If you think you have taken too much of this medicine contact a poison control center or emergency room at once. NOTE: This medicine is only for you. Do not share this medicine with others. What if I miss a dose? If you miss a dose, take it as soon as you can unless it is more than 12 hours late. If it is more than 12 hours late, skip the missed dose. Take the next dose at the normal time. What may interact with this medication? Other medications may affect the way this medication works. Talk with your care team about all of the medications you take. They may suggest changes to your treatment plan to lower the risk of side effects and to make sure your medications work as intended. This list may not describe all possible interactions. Give your health care provider a list of all  the medicines, herbs, non-prescription drugs, or dietary supplements you use. Also tell them if you smoke, drink alcohol, or use illegal drugs. Some items may interact with your medicine. What should I watch for while using this medication? Visit your care team for regular checks on your progress. Tell your care team if your symptoms do not start to get better or if they get worse. You may need blood work while taking this medication. What side effects may I notice from receiving this medication? Side effects that you should report to your care team as soon as possible: Allergic reactions--skin rash, itching, hives, swelling of the face, lips, tongue, or throat Liver injury--right upper belly pain, loss of appetite, nausea, light-colored stool, dark yellow or brown urine, yellowing skin or eyes, unusual weakness or fatigue Side effects that usually do not require medical attention (report these to your care team if they continue or are bothersome): Back pain Diarrhea Hot flashes Stomach pain Trouble sleeping This list may not describe all possible side effects. Call your doctor for medical advice about side effects. You may report side effects to FDA at 1-800-FDA-1088. Where should I keep my medication? Keep out of the reach of children and pets. Store at room temperature between 20 and 25 degrees C (68 and 77 degrees F). Get rid of any unused medication after the expiration date. To get rid of medications that are no longer needed or have expired: Take the medication to a medication take-back program. Check with   your pharmacy or law enforcement to find a location. If you cannot return the medication, check the label or package insert to see if the medication should be thrown out in the garbage or flushed down the toilet. If you are not sure, ask your care team. If it is safe to put it in the trash, take the medication out of the container. Mix the medication with cat litter, dirt, coffee  grounds, or other unwanted substance. Seal the mixture in a bag or container. Put it in the trash. NOTE: This sheet is a summary. It may not cover all possible information. If you have questions about this medicine, talk to your doctor, pharmacist, or health care provider.  2024 Elsevier/Gold Standard (2021-09-17 00:00:00)

## 2024-01-23 NOTE — Progress Notes (Signed)
 GYNECOLOGY  VISIT   HPI: 68 y.o.   Single  Caucasian female   G0P0 with Patient's last menstrual period was 04/26/2010.   here for: Discuss HRT - Hot flashes, heightened emotions are main concerns. Currently weaning off Doxepin  and has started noticing these symptoms since then.   Is in therapy.  Sees a psychiatrist at [facility].   Has OCD, insomnia, and general anxiety.   Goal to stop Ativan  and switch her antidepressant.   She is tapering off her Doxepin .   She is having an increase in her hot flashes.    Has Trazodone for sleep.   Working on weight loss through diet and exercise.    Difficult 2 years.  Doctorate program closing at the university.  Mother died following an illness.  GYNECOLOGIC HISTORY: Patient's last menstrual period was 04/26/2010. Contraception:  PMP Menopausal hormone therapy:  n/a Last 2 paps:  03/31/22 neg HR HPV neg, 04/01/14 neg History of abnormal Pap or positive HPV:  no Mammogram:  01/20/24 results not back yet, 03/26/19 Breast Density Cat C, BIRADS Cat 1 neg         OB History     Gravida  0   Para      Term      Preterm      AB      Living         SAB      IAB      Ectopic      Multiple      Live Births                 There are no active problems to display for this patient.   Past Medical History:  Diagnosis Date   Anxiety    Arthritis of both knees 2021   Broken foot 2013   right   Bronchitis    Depression    Family history of adverse reaction to anesthesia    mother with PONV after gallbladder removal   Hypothyroidism    OCD (obsessive compulsive disorder)    PONV (postoperative nausea and vomiting)    after D&C   Retinal tear, bilateral    03/2016    Thyroid  disease 1994   hypothyroid--Dr. Chyrl Karst   Vitamin D deficiency     Past Surgical History:  Procedure Laterality Date   CHOLECYSTECTOMY N/A 11/07/2020   Procedure: LAPAROSCOPIC CHOLECYSTECTOMY;  Surgeon: Dasie Leonor CROME, MD;   Location: [facility];  Service: General;  Laterality: N/A;   DILATATION & CURRETTAGE/HYSTEROSCOPY WITH RESECTOCOPE N/A 10/18/2012   Procedure: DILATATION & CURETTAGE/HYSTEROSCOPY WITH RESECTOCOPE;  Surgeon: Bobie DELENA Cary, MD;  Location: [facility];  Service: Gynecology;  Laterality: N/A;   DILATION AND CURETTAGE OF UTERUS     EYE SURGERY     KNEE ARTHROSCOPY W/ MENISCAL REPAIR Left 10/2019   LAPAROSCOPY Left 10/18/2012   Procedure: LEFT S&O;  Surgeon: Bobie DELENA Cary, MD;  Location: [facility];  Service: Gynecology;  Laterality: Left;  PELVIC WASHING   RETINAL TEAR REPAIR CRYOTHERAPY Bilateral    03/2016   SALPINGOOPHORECTOMY Bilateral 10/18/2012   Procedure: SALPINGO OOPHORECTOMY;  Surgeon: Bobie DELENA Cary, MD;  Location: [facility];  Service: Gynecology;  Laterality: Bilateral;   TONSILLECTOMY     age 65yrs   TONSILLECTOMY AND ADENOIDECTOMY     age 4    Current Outpatient Medications  Medication Sig Dispense Refill   Cholecalciferol (VITAMIN D3) 2000 units TABS Take 2,000 Units by mouth daily.  doxepin  (SINEQUAN ) 50 MG capsule Take 50 mg by mouth at bedtime.     fexofenadine (ALLEGRA) 60 MG tablet Take 60 mg by mouth daily as needed for allergies.     fluticasone (FLONASE) 50 MCG/ACT nasal spray Place 1 spray into both nostrils daily as needed for allergies.  1   Fluticasone-Salmeterol (ADVAIR) 250-50 MCG/DOSE AEPB Inhale 1 puff into the lungs daily as needed (Shortness of breath when sick and bronchitis).     ibuprofen  (ADVIL ,MOTRIN ) 800 MG tablet Take 1 tablet (800 mg total) by mouth every 8 (eight) hours as needed for pain. (Patient taking differently: Take 400 mg by mouth every 8 (eight) hours as needed for pain.) 30 tablet 0   LORazepam  (ATIVAN ) 0.5 MG tablet Take 1 mg by mouth daily as needed for anxiety. (Patient taking differently: Take 0.75 mg by mouth daily as needed for anxiety. 3 times daily)     MAGNESIUM PO Take by mouth.     melatonin 5 MG TABS Take 5 mg by mouth at bedtime.     Multiple  Vitamins-Minerals (MULTIVITAMIN PO) Take 1 tablet by mouth daily.     SYNTHROID 112 MCG tablet Take 112 mcg by mouth daily before breakfast.     traZODone (DESYREL) 50 MG tablet Take 50-100 mg by mouth at bedtime.     VENTOLIN  HFA 108 (90 BASE) MCG/ACT inhaler Inhale 2 puffs into the lungs every 6 (six) hours as needed (only when sick with  bronchiti).  0   vitamin C (ASCORBIC ACID) 500 MG tablet Take 500 mg by mouth daily.     doxepin  (SINEQUAN ) 10 MG capsule TAKE 1 CAPSULE AT BEDTIME ALONG WITH 50MG  AND 25MG  DOXEPIN  (TOTAL 85MG ) ORALLY ONCE A DAY 30 DAYS for 90 days (Patient not taking: Reported on 01/23/2024)     doxepin  (SINEQUAN ) 25 MG capsule Take 25 mg by mouth daily. (Patient not taking: Reported on 01/23/2024)     LORazepam  (ATIVAN ) 1 MG tablet Take 1 mg by mouth 2 (two) times daily as needed. (Patient not taking: Reported on 01/23/2024)     No current facility-administered medications for this visit.     ALLERGIES: Diclofenac, Penicillins, Prednisone, Sudafed [pseudoephedrine hcl], and Thimerosal (thiomersal)  Family History  Problem Relation Age of Onset   Osteoarthritis Mother    Thyroid  disease Mother    Other Mother        fractured disc   Lung cancer Father        mets to liver/dec'd age 16   Hypertension Father    Thyroid  disease Maternal Grandmother    Asthma Neg Hx     Social History   Socioeconomic History   Marital status: Single    Spouse name: Not on file   Number of children: Not on file   Years of education: Not on file   Highest education level: Not on file  Occupational History   Not on file  Tobacco Use   Smoking status: Never   Smokeless tobacco: Never  Vaping Use   Vaping status: Never Used  Substance and Sexual Activity   Alcohol use: No    Alcohol/week: 0.0 standard drinks of alcohol   Drug use: No   Sexual activity: Never    Birth control/protection: Abstinence, Post-menopausal    Comment: pt. is a virgin  Other Topics Concern   Not on  file  Social History Narrative   Not on file   Social Drivers of Health   Financial Resource Strain: Not on  file  Food Insecurity: Not on file  Transportation Needs: Not on file  Physical Activity: Not on file  Stress: Not on file  Social Connections: Not on file  Intimate Partner Violence: Not on file    Review of Systems  All other systems reviewed and are negative.   PHYSICAL EXAMINATION:   BP 132/86 (BP Location: Left Arm, Patient Position: Sitting)   Pulse 86   LMP 04/26/2010   SpO2 97%     General appearance: alert, cooperative and appears stated age   ASSESSMENT:  Menopausal symptoms.   Vasomotor symptoms.  OCD.  Generalized anxiety.   PLAN:  We discussed Veozah, Paxil, Effexor, Gabapentin , and HRT.  HRT not recommended due to risk of stroke, DVT, PE, MI, and breast cancer.  We focused on Veozah.  Risks and benefits discussed.  Start Veozah after LFTs are back and normal.   Anticipate follow up in beginning of December for a recheck and repeat LFTs if she is started on the Veozah.  Has annual exam in March, 2026.     35 min  total time was spent for this patient encounter, including preparation, face-to-face counseling with the patient, coordination of care, and documentation of the encounter.

## 2024-01-24 ENCOUNTER — Encounter: Payer: Self-pay | Admitting: Obstetrics and Gynecology

## 2024-01-24 ENCOUNTER — Ambulatory Visit: Payer: Self-pay | Admitting: Obstetrics and Gynecology

## 2024-01-24 DIAGNOSIS — R739 Hyperglycemia, unspecified: Secondary | ICD-10-CM

## 2024-01-24 LAB — COMPREHENSIVE METABOLIC PANEL WITH GFR
AG Ratio: 1.7 (calc) (ref 1.0–2.5)
ALT: 21 U/L (ref 6–29)
AST: 16 U/L (ref 10–35)
Albumin: 4.3 g/dL (ref 3.6–5.1)
Alkaline phosphatase (APISO): 73 U/L (ref 37–153)
BUN: 19 mg/dL (ref 7–25)
CO2: 25 mmol/L (ref 20–32)
Calcium: 9.6 mg/dL (ref 8.6–10.4)
Chloride: 106 mmol/L (ref 98–110)
Creat: 0.84 mg/dL (ref 0.50–1.05)
Globulin: 2.6 g/dL (ref 1.9–3.7)
Glucose, Bld: 116 mg/dL — ABNORMAL HIGH (ref 65–99)
Potassium: 4.3 mmol/L (ref 3.5–5.3)
Sodium: 140 mmol/L (ref 135–146)
Total Bilirubin: 0.4 mg/dL (ref 0.2–1.2)
Total Protein: 6.9 g/dL (ref 6.1–8.1)
eGFR: 76 mL/min/1.73m2 (ref >=60)

## 2024-01-24 MED ORDER — VEOZAH 45 MG PO TABS: 45.0000 mg | ORAL_TABLET | Freq: Every day | ORAL | 2 refills | Status: DC

## 2024-01-25 ENCOUNTER — Other Ambulatory Visit: Payer: Self-pay | Admitting: Obstetrics and Gynecology

## 2024-01-25 NOTE — Progress Notes (Signed)
 Additional labs unable to be added to previous labs.

## 2024-01-25 NOTE — Telephone Encounter (Signed)
 The Veozah isn't covered by [employer], pharmacy is asking that the medication be changed to something else.   Please advise

## 2024-01-25 NOTE — Telephone Encounter (Signed)
 Spoke with Siva at [facility]. Was advised Veozah not on formulary, PA not appropriate. Covered alternatives are:   Climara  Pro Combi Patch Prempro

## 2024-01-30 ENCOUNTER — Telehealth: Payer: Self-pay | Admitting: Obstetrics and Gynecology

## 2024-01-31 ENCOUNTER — Other Ambulatory Visit

## 2024-01-31 NOTE — Telephone Encounter (Signed)
 Opened in error.   I see that the patient has a her lab appointment today for the A1C.   Encounter closed.

## 2024-01-31 NOTE — Telephone Encounter (Signed)
 Routing to Dr. Nikki -no information included in this encounter.

## 2024-01-31 NOTE — Telephone Encounter (Signed)
 Pt sent me this message via mychart.   Just an Daisy [name] can we close this request?

## 2024-02-04 ENCOUNTER — Encounter: Payer: Self-pay | Admitting: Cardiovascular Disease

## 2024-02-10 ENCOUNTER — Ambulatory Visit: Admitting: General Practice

## 2024-02-15 ENCOUNTER — Ambulatory Visit: Admitting: General Practice

## 2024-02-27 LAB — COLOGUARD: COLOGUARD: NEGATIVE

## 2024-03-06 ENCOUNTER — Encounter: Payer: Self-pay | Admitting: Obstetrics and Gynecology

## 2024-03-09 ENCOUNTER — Encounter: Payer: Self-pay | Admitting: Cardiovascular Disease

## 2024-03-29 ENCOUNTER — Encounter (HOSPITAL_BASED_OUTPATIENT_CLINIC_OR_DEPARTMENT_OTHER)

## 2024-03-29 ENCOUNTER — Ambulatory Visit (HOSPITAL_BASED_OUTPATIENT_CLINIC_OR_DEPARTMENT_OTHER): Admitting: Pulmonary Disease

## 2024-04-04 ENCOUNTER — Telehealth: Payer: Self-pay | Admitting: Cardiovascular Disease

## 2024-04-04 NOTE — Telephone Encounter (Signed)
 Called patient left Dr.O'Neal's advice on personal voice mail.

## 2024-04-04 NOTE — Telephone Encounter (Signed)
 Patient c/o Palpitations:  STAT if patient reporting lightheadedness, shortness of breath, or chest pain  How long have you had palpitations/irregular HR/ Afib? Are you having the symptoms now? No   Are you currently experiencing lightheadedness, SOB or CP? No   Do you have a history of afib (atrial fibrillation) or irregular heart rhythm? Yes   Have you checked your BP or HR? (document readings if available): 88 HR   Are you experiencing any other symptoms? No    Pt has been having episodes of palpitations over the weekend. After having a 45 minute long episode this morning, she called in. Pt has been transitioning from one medication to another and believes it could be due that that. Please advise.

## 2024-04-04 NOTE — Telephone Encounter (Signed)
 Spoke to patient she stated she has been having palpitations.She recently weaned off Gabapentin .Stated she noticed palpitations after she started taking Seroquel.No chest pain.Advised she needs to call Dr.that prescribed.She will keep appointment scheduled with Josefa Beauvais NP 12/18 at 10:05 am. I will make Dr.O'Neal aware.

## 2024-04-10 NOTE — Progress Notes (Unsigned)
 Cardiology Clinic Note   Patient Name: Daisy [name] Date of Encounter: 04/12/2024  Primary Care Provider:  Regino Slater, MD Primary Cardiologist:  Daisy ONEIDA Decent, MD  Patient Profile    Daisy [name] 68 year old female presents the clinic today for follow-up evaluation of her coronary artery disease and palpitations.  Past Medical History    Past Medical History:  Diagnosis Date   Anxiety    Arthritis of both knees 2021   Broken foot 2013   right   Bronchitis    Depression    Elevated glucose    Family history of adverse reaction to anesthesia    mother with PONV after gallbladder removal   Hypothyroidism    OCD (obsessive compulsive disorder)    PONV (postoperative nausea and vomiting)    after D&C   Retinal tear, bilateral    03/2016    Thyroid  disease 1994   hypothyroid--Dr. Chyrl [name]   Vitamin D deficiency    Past Surgical History:  Procedure Laterality Date   CHOLECYSTECTOMY N/A 11/07/2020   Procedure: LAPAROSCOPIC CHOLECYSTECTOMY;  Surgeon: Daisy Leonor CROME, MD;  Location: [facility];  Service: General;  Laterality: N/A;   DILATATION & CURRETTAGE/HYSTEROSCOPY WITH RESECTOCOPE N/A 10/18/2012   Procedure: DILATATION & CURETTAGE/HYSTEROSCOPY WITH RESECTOCOPE;  Surgeon: Daisy DELENA Cary, MD;  Location: [facility];  Service: Gynecology;  Laterality: N/A;   DILATION AND CURETTAGE OF UTERUS     EYE SURGERY     KNEE ARTHROSCOPY W/ MENISCAL REPAIR Left 10/2019   LAPAROSCOPY Left 10/18/2012   Procedure: LEFT S&O;  Surgeon: Daisy DELENA Cary, MD;  Location: [facility];  Service: Gynecology;  Laterality: Left;  PELVIC WASHING   RETINAL TEAR REPAIR CRYOTHERAPY Bilateral    03/2016   SALPINGOOPHORECTOMY Bilateral 10/18/2012   Procedure: SALPINGO OOPHORECTOMY;  Surgeon: Daisy DELENA Cary, MD;  Location: [facility];  Service: Gynecology;  Laterality: Bilateral;   TONSILLECTOMY     age 40yrs   TONSILLECTOMY AND ADENOIDECTOMY     age 75    Allergies  Allergies  Allergen  Reactions   Diclofenac Other (See Comments)    Gi -upset   Penicillins Other (See Comments)    Found out on allergy  testing Reaction: over 20 years ago   Prednisone     Other Reaction(s): Increased HR   Sudafed [Pseudoephedrine Hcl] Other (See Comments)    Feels wired and like has had too much coffee   Thimerosal (Thiomersal) Rash    History of Present Illness    [name] has a PMH of palpitations, shortness of breath, obesity, GERD, and coronary artery disease.  Coronary calcium scoring on 07/04/2023 showed a total score of 47 in the LAD region.  She was seen in follow-up by Dr. Decent on 02/25/2023.  During that time she presented with palpitations.  She reported a stressful year.  She noted that she had lost her mother.  She was experiencing grief and anxiety with this.  She had back-to-back episodes of bronchitis.  The second case progressed to pneumonia.  She noted that she would have bronchitis almost every year.  She reported that she had pneumonia 3 times, influenza twice, and COVID once.  She reported that she had palpitations that were related to her thyroid  levels and stress.  She reported that when her thyroid  levels were above 3 she would experience palpitations.  The palpitations would last for 3-4 hours.  She also noted palpitations during episodes of high stress.  Her palpitations were alleviated  with the use of Ativan .  She denied chest pain and shortness of breath with palpitations.  She had a goal of losing around 50 pounds, increasing her physical activity.  She had joined weight watchers was recommended that she continue to monitor her heart rhythm with her Apple Watch or Kardia mobile.  She was encouraged to maintain hydration, reduce caffeine intake and get an adequate amount of sleep.  Coronary calcium scoring was ordered.  She presented to the clinic 10/03/23 for follow-up evaluation and stated she continued to have intermittent episodes of palpitations which she related  to her bronchitis.  We reviewed her intermittent episodes over the past several months.  She had also been seen by pulmonology.  They reviewed her symptoms and felt she had mild asthma.  Her PCP did not agree with this diagnosis and felt that she has reactive airway disease.  We reviewed her cardiac event monitor results and coronary calcium scoring.  I reviewed triggers for palpitations.  She expressed understanding.  I also discussed vagal maneuvers.  At that time she wished to continue to work on her diet and increasing physical activity.  I planned repeat her fasting lipids in 3 months.  We planned repeat  TSH at the same time and planned follow-up after lab work in about 4 months.  She contacted nurse triage line on 04/04/2024.  She reported that she had episodes of palpitations over the weekend.  She reported 1x 45-minute long episode during the morning of her call.  She noted that she had been transitioning from 1 medication to another and felt that the medication may have been a contributing factor.  She presents to the clinic today for follow-up evaluation and states she was trying to increase her physical activity and notes that she was up to 22 minutes daily in September.  She was then admitted to rehab to develop skills to better help with anxiety and to have her Ativan  reduced.  Her gabapentin  was reduced.  Her trazodone was stopped and she was placed on Seroquel.  Her EKG today shows normal sinus rhythm.  She also wore a cardiac event monitor with her PCP which showed an SVT.  We reviewed this.  She notes that her palpitations have been improving over the last several days.  Her blood pressure today is 120/90.  I will have her continue her current medication regimen.  We reviewed vagal maneuvers and triggers for palpitations.  Her cholesterol panel on 04/10/2024 showed a total cholesterol of 163 LDL of 102 and HDL of 39.  She continues to work on weight loss and is increasing her physical activity.   We will plan follow-up in 6 months.  Today she denies chest pain, shortness of breath, lower extremity edema, and fatigue.   Home Medications    Prior to Admission medications   Medication Sig Start Date End Date Taking? Authorizing Provider  Cholecalciferol (VITAMIN D3) 2000 units TABS Take 2,000 Units by mouth daily.    [provider]  doxepin  (SINEQUAN ) 25 MG capsule Take 25 mg by mouth daily. 01/25/23   [provider]  doxepin  (SINEQUAN ) 50 MG capsule Take 50 mg by mouth at bedtime.    [provider]  fexofenadine (ALLEGRA) 60 MG tablet Take 60 mg by mouth daily as needed for allergies.    [provider]  fluticasone (FLONASE) 50 MCG/ACT nasal spray Place 1 spray into both nostrils daily as needed for allergies. 04/01/14   [provider]  Fluticasone-Salmeterol (  ADVAIR) 250-50 MCG/DOSE AEPB Inhale 1 puff into the lungs daily as needed (Shortness of breath when sick and bronchitis).    [provider]  ibuprofen  (ADVIL ,MOTRIN ) 800 MG tablet Take 1 tablet (800 mg total) by mouth every 8 (eight) hours as needed for pain. Patient taking differently: Take 400 mg by mouth every 8 (eight) hours as needed for pain. 10/18/12   Cathlyn JAYSON Nikki Daisy FORBES, MD  LORazepam  (ATIVAN ) 0.5 MG tablet Take 1 mg by mouth daily as needed for anxiety. 05/15/20   [provider]  LORazepam  (ATIVAN ) 1 MG tablet Take 1 mg by mouth 2 (two) times daily as needed. 04/12/23   [provider]  melatonin 5 MG TABS Take 5 mg by mouth at bedtime.    [provider]  Multiple Vitamins-Minerals (MULTIVITAMIN PO) Take 1 tablet by mouth daily.    [provider]  SYNTHROID 112 MCG tablet Take 112 mcg by mouth daily before breakfast. 03/28/19   [provider]  VENTOLIN  HFA 108 (90 BASE) MCG/ACT inhaler Inhale 2 puffs into the lungs every 6 (six) hours as needed (only when sick with  bronchiti). 04/01/14   [provider]   vitamin C (ASCORBIC ACID) 500 MG tablet Take 500 mg by mouth daily.    [provider]    Family History    Family History  Problem Relation Age of Onset   Osteoarthritis Mother    Thyroid  disease Mother    Other Mother        fractured disc   Lung cancer Father        mets to liver/dec'd age 57   Hypertension Father    Thyroid  disease Maternal Grandmother    Asthma Neg Hx    She indicated that her mother is alive. She indicated that her father is deceased. She indicated that her brother is alive. She indicated that the status of her maternal grandmother is unknown. She indicated that the status of her neg hx is unknown.  Social History    Social History   Socioeconomic History   Marital status: Single    Spouse name: Not on file   Number of children: Not on file   Years of education: Not on file   Highest education level: Not on file  Occupational History   Not on file  Tobacco Use   Smoking status: Never   Smokeless tobacco: Never  Vaping Use   Vaping status: Never Used  Substance and Sexual Activity   Alcohol use: No    Alcohol/week: 0.0 standard drinks of alcohol   Drug use: No   Sexual activity: Never    Birth control/protection: Abstinence, Post-menopausal    Comment: pt. is a virgin  Other Topics Concern   Not on file  Social History Narrative   Not on file   Social Drivers of Health   Tobacco Use: Low Risk (04/12/2024)   Patient History    Smoking Tobacco Use: Never    Smokeless Tobacco Use: Never    Passive Exposure: Not on file  Financial Resource Strain: Not on file  Food Insecurity: Low Risk (02/09/2024)   Received from [facility]   Epic    Within the past 12 months, you worried that your food would run out before you got money to buy more: Never true    Within the past 12 months, the food you bought just didn't last and you didn't have money to get more. : Never true  Transportation  Needs: No Transportation Needs (02/09/2024)    Received from [facility]    In the past 12 months, has lack of reliable transportation kept you from medical appointments, meetings, work or from getting things needed for daily living? : No  Physical Activity: Not on file  Stress: Not on file  Social Connections: Not on file  Intimate Partner Violence: Not on file  Depression (EYV7-0): Not on file  Alcohol Screen: Not on file  Housing: Low Risk (02/09/2024)   Received from [facility]   Epic    What is your living situation today?: I have a steady place to live    Think about the place you live. Do you have problems with any of the following? Choose all that apply:: None/None on this list  Utilities: Low Risk (02/09/2024)   Received from [facility]   Utilities    In the past 12 months has the electric, gas, oil, or water company threatened to shut off services in your home? : No  Health Literacy: Not on file     Review of Systems    General:  No chills, fever, night sweats or weight changes.  Cardiovascular:  No chest pain, dyspnea on exertion, edema, orthopnea, palpitations, paroxysmal nocturnal dyspnea. Dermatological: No rash, lesions/masses Respiratory: No cough, dyspnea Urologic: No hematuria, dysuria Abdominal:   No nausea, vomiting, diarrhea, bright red blood per rectum, melena, or hematemesis Neurologic:  No visual changes, wkns, changes in mental status. All other systems reviewed and are otherwise negative except as noted above.  Physical Exam    VS:  BP (!) 120/90   Pulse 81   Ht 5' 3.5 (1.613 m)   Wt 184 lb (83.5 kg)   LMP 04/26/2010   SpO2 96%   BMI 32.08 kg/m  , BMI Body mass index is 32.08 kg/m. GEN: Well nourished, well developed, in no acute distress. HEENT: normal. Neck: Supple, no JVD, carotid bruits, or masses. Cardiac: RRR, no murmurs, rubs, or gallops. No clubbing, cyanosis, edema.  Radials/DP/PT 2+ and equal bilaterally.  Respiratory:  Respirations regular and unlabored,  clear to auscultation bilaterally. GI: Soft, nontender, nondistended, BS + x 4. MS: no deformity or atrophy. Skin: warm and dry, no rash. Neuro:  Strength and sensation are intact. Psych: Normal affect.  Accessory Clinical Findings    Recent Labs: 01/23/2024: ALT 21; BUN 19; Creat 0.84; Potassium 4.3; Sodium 140   Recent Lipid Panel No results found for: CHOL, TRIG, HDL, CHOLHDL, VLDL, LDLCALC, LDLDIRECT    ECG personally reviewed by me today-none today.EKG Interpretation Date/Time:  Thursday April 12 2024 10:14:41 EST Ventricular Rate:  80 PR Interval:  152 QRS Duration:  74 QT Interval:  354 QTC Calculation: 408 R Axis:   -16  Text Interpretation: Normal sinus rhythm Low voltage QRS When compared with ECG of 28-Nov-2022 11:41, No significant change was found Confirmed by Emelia Hazy [phone]) on 04/12/2024 10:17:51 AM   Coronary calcium scoring 07/04/2023  EXAM: Coronary Calcium Score   TECHNIQUE: A gated, non-contrast computed tomography scan of the heart was performed using 3mm slice thickness. Axial images were analyzed on a dedicated workstation. Calcium scoring of the coronary arteries was performed using the Agatston method.   FINDINGS: Coronary Calcium Score:   Left main: 0   Left anterior descending artery: 47   Left circumflex artery: 0   Right coronary artery: 0   Total: 47   Percentile: 69th   Pericardium: Normal.   Non-cardiac: See separate  report from [facility].   IMPRESSION: 1. Coronary calcium score of 47. This was 69th percentile for age-, race-, and sex-matched controls.   RECOMMENDATIONS: Coronary artery calcium (CAC) score is a strong predictor of incident coronary heart disease (CHD) and provides predictive information beyond traditional risk factors. CAC scoring is reasonable to use in the decision to withhold, postpone, or initiate statin therapy in intermediate-risk or selected  borderline-risk asymptomatic adults (age 45-75 years and LDL-C >=70 to <190 mg/dL) who do not have diabetes or established atherosclerotic cardiovascular disease (ASCVD).* In intermediate-risk (10-year ASCVD risk >=7.5% to <20%) adults or selected borderline-risk (10-year ASCVD risk >=5% to <7.5%) adults in whom a CAC score is measured for the purpose of making a treatment decision the following recommendations have been made:   If CAC=0, it is reasonable to withhold statin therapy and reassess in 5 to 10 years, as long as higher risk conditions are absent (diabetes mellitus, family history of premature CHD in first degree relatives (males <55 years; females <65 years), cigarette smoking, or LDL >=190 mg/dL).   If CAC is 1 to 99, it is reasonable to initiate statin therapy for patients >=74 years of age.  Echocardiogram 07/04/2023  IMPRESSIONS     1. Difficult acoustic windows.   2. Left ventricular ejection fraction, by estimation, is 70 to 75%. The  left ventricle has hyperdynamic function. The left ventricle has no  regional wall motion abnormalities. There is moderate left ventricular  hypertrophy. Left ventricular diastolic  parameters are indeterminate.   3. Right ventricular systolic function is normal. The right ventricular  size is normal.   4. Trivial mitral valve regurgitation.   5. The aortic valve is tricuspid. Aortic valve regurgitation is not  visualized.   FINDINGS   Left Ventricle: Left ventricular ejection fraction, by estimation, is 70  to 75%. The left ventricle has hyperdynamic function. The left ventricle  has no regional wall motion abnormalities. The left ventricular internal  cavity size was normal in size.  There is moderate left ventricular hypertrophy. Left ventricular diastolic  parameters are indeterminate.   Right Ventricle: The right ventricular size is normal. Right vetricular  wall thickness was not assessed. Right ventricular systolic  function is  normal.   Left Atrium: Left atrial size was normal in size.   Right Atrium: Right atrial size was normal in size.   Pericardium: Trivial pericardial effusion is present.   Mitral Valve: There is mild thickening of the mitral valve leaflet(s).  Trivial mitral valve regurgitation.   Tricuspid Valve: The tricuspid valve is grossly normal. Tricuspid valve  regurgitation is trivial.   Aortic Valve: The aortic valve is tricuspid. Aortic valve regurgitation is  not visualized.   Pulmonic Valve: The pulmonic valve was not well visualized. Pulmonic valve  regurgitation is not visualized.   Aorta: The aortic root and ascending aorta are structurally normal, with  no evidence of dilitation.   IAS/Shunts: No atrial level shunt detected by color flow Doppler.      Assessment & Plan   1.  Palpitations-Notes 1 episode of palpitations that lasted for around 45 minutes on 04/04/2024.  Has been monitoring closely and reports that over the last several days her palpitations have been improving.  We reviewed that this is multifactorial related to anxiety, medication, TSH, and physical activity.  Repeat TSH on 11/28/2022 was noted to be elevated at 5.840.  TSH 2.758 on 04/10/2024. May use vagal maneuvers-reviewed Avoid triggers caffeine, chocolate, EtOH, dehydration etc.-reviewed  Coronary artery  disease-no chest pain today.  Denies exertional chest discomfort.  Coronary calcium score was 47 on 07/04/2023.  Testing showed coronary calcium in the LAD region. Increase physical activity-reviewed High-fiber diet-reviewed  Hyperlipidemia-LDL 118 on 03/15/2023.  Goal LDL less than 100 Again, wishes to defer statin therapy at this time.  Would like to continue to work on diet and increasing physical activity.  Will restart physical activity.  Plans to start with 10 minutes of walking and progress as tolerated. High-fiber diet Increase physical activity as tolerated Continue weight  loss   Disposition: Follow-up with Dr. Barbaraann or me in 6 months.    Josefa HERO. Chadrick Sprinkle NP-C     04/12/2024, 10:56 AM [facility] [address] Office [phone] Fax [phone]    I spent 14 minutes examining this patient, reviewing medications, and using patient centered shared decision making involving their cardiac care.   I spent  20 minutes reviewing past medical history,  medications, and prior cardiac tests.

## 2024-04-12 ENCOUNTER — Ambulatory Visit: Attending: Cardiovascular Disease | Admitting: General Practice

## 2024-04-12 ENCOUNTER — Encounter: Payer: Self-pay | Admitting: General Practice

## 2024-04-12 VITALS — BP 120/90 | HR 81 | Ht 63.5 in | Wt 184.0 lb

## 2024-04-12 DIAGNOSIS — R002 Palpitations: Secondary | ICD-10-CM

## 2024-04-12 DIAGNOSIS — I2510 Atherosclerotic heart disease of native coronary artery without angina pectoris: Secondary | ICD-10-CM

## 2024-04-12 DIAGNOSIS — E782 Mixed hyperlipidemia: Secondary | ICD-10-CM | POA: Diagnosis not present

## 2024-04-12 NOTE — Patient Instructions (Signed)
 Medication Instructions:  It is ok for you to take B12 supplement. *If you need a refill on your cardiac medications before your next appointment, please call your pharmacy*  Lab Work: None ordered If you have labs (blood work) drawn today and your tests are completely normal, you will receive your results only by: MyChart Message (if you have MyChart) OR A paper copy in the mail If you have any lab test that is abnormal or we need to change your treatment, we will call you to review the results.  Testing/Procedures: None ordered  Follow-Up: At [facility], you and your health needs are our priority.  As part of our continuing mission to provide you with exceptional heart care, our providers are all part of one team.  This team includes your primary Cardiologist (physician) and Advanced Practice Providers or APPs (Physician Assistants and Nurse Practitioners) who all work together to provide you with the care you need, when you need it.  Your next appointment:   6 month(s)  Provider:   Darryle ONEIDA Decent, MD or ANY APP   We recommend signing up for the patient portal called MyChart.  Sign up information is provided on this After Visit Summary.  MyChart is used to connect with patients for Virtual Visits (Telemedicine).  Patients are able to view lab/test results, encounter notes, upcoming appointments, etc.  Non-urgent messages can be sent to your provider as well.   To learn more about what you can do with MyChart, go to [email or web address].   Other Instructions Exercise regularly as told by your doctor. Make sure to weight daily and keep a weight log.   Moderate-intensity exercise is any activity that gets you moving enough to burn at least three times more energy (calories) than if you were sitting. Examples of moderate exercise include: Walking a mile in 15 minutes. Doing light yard work. Biking at an easy pace. Most people should get at least 150 minutes of  moderate-intensity exercise a week to maintain their body weight.  Increase your water intake: Maintain hydration

## 2024-04-13 ENCOUNTER — Telehealth: Payer: Self-pay | Admitting: Obstetrics and Gynecology

## 2024-04-13 NOTE — Telephone Encounter (Signed)
 Patient called in to cancel future appointments with Dr. Nikki. Said she is leaving the practice.

## 2024-04-16 ENCOUNTER — Ambulatory Visit: Admitting: Obstetrics and Gynecology

## 2024-07-03 ENCOUNTER — Ambulatory Visit: Admitting: Obstetrics and Gynecology

## 2024-07-04 ENCOUNTER — Encounter (HOSPITAL_BASED_OUTPATIENT_CLINIC_OR_DEPARTMENT_OTHER)

## 2024-07-04 ENCOUNTER — Ambulatory Visit (HOSPITAL_BASED_OUTPATIENT_CLINIC_OR_DEPARTMENT_OTHER)

## 2024-07-09 ENCOUNTER — Encounter (HOSPITAL_BASED_OUTPATIENT_CLINIC_OR_DEPARTMENT_OTHER)

## 2024-07-09 ENCOUNTER — Ambulatory Visit (HOSPITAL_BASED_OUTPATIENT_CLINIC_OR_DEPARTMENT_OTHER)
